# Patient Record
Sex: Female | Born: 1990 | Race: White | Hispanic: No | Marital: Single | Smoking: Never smoker
Health system: Southern US, Community
[De-identification: ages and names within clinical notes are randomized; demographics above are authoritative.]

---
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
## 2013-09-20 ENCOUNTER — Ambulatory Visit (INDEPENDENT_AMBULATORY_CARE_PROVIDER_SITE_OTHER): Payer: Managed Care, Other (non HMO) | Admitting: Emergency Medicine

## 2013-09-20 VITALS — BP 116/54 | HR 85 | Temp 98.3°F | Resp 16 | Ht 62.25 in | Wt 192.4 lb

## 2013-09-20 DIAGNOSIS — Z23 Encounter for immunization: Secondary | ICD-10-CM

## 2013-09-20 DIAGNOSIS — Z309 Encounter for contraceptive management, unspecified: Secondary | ICD-10-CM

## 2013-09-20 DIAGNOSIS — Z0000 Encounter for general adult medical examination without abnormal findings: Secondary | ICD-10-CM

## 2013-09-20 DIAGNOSIS — Z113 Encounter for screening for infections with a predominantly sexual mode of transmission: Secondary | ICD-10-CM

## 2013-09-20 LAB — POCT CBC
Granulocyte percent: 60.2 %G (ref 37–80)
HEMATOCRIT: 40.4 % (ref 37.7–47.9)
Hemoglobin: 12.9 g/dL (ref 12.2–16.2)
LYMPH, POC: 1.6 (ref 0.6–3.4)
MCH: 27.4 pg (ref 27–31.2)
MCHC: 31.9 g/dL (ref 31.8–35.4)
MCV: 85.8 fL (ref 80–97)
MID (cbc): 0.4 (ref 0–0.9)
MPV: 9.9 fL (ref 0–99.8)
POC GRANULOCYTE: 3 (ref 2–6.9)
POC LYMPH %: 32.6 % (ref 10–50)
POC MID %: 7.2 %M (ref 0–12)
Platelet Count, POC: 251 10*3/uL (ref 142–424)
RBC: 4.71 M/uL (ref 4.04–5.48)
RDW, POC: 15 %
WBC: 5 10*3/uL (ref 4.6–10.2)

## 2013-09-20 LAB — LIPID PANEL
CHOL/HDL RATIO: 3.2 ratio
Cholesterol: 175 mg/dL (ref 0–200)
HDL: 54 mg/dL (ref >39)
LDL Cholesterol: 104 mg/dL — ABNORMAL HIGH (ref 0–99)
Triglycerides: 84 mg/dL (ref <150)
VLDL: 17 mg/dL (ref 0–40)

## 2013-09-20 LAB — POCT URINE PREGNANCY: PREG TEST UR: NEGATIVE

## 2013-09-20 LAB — POCT URINALYSIS DIPSTICK
BILIRUBIN UA: NEGATIVE
Glucose, UA: NEGATIVE
Leukocytes, UA: NEGATIVE
NITRITE UA: NEGATIVE
PH UA: 6
PROTEIN UA: NEGATIVE
Spec Grav, UA: 1.025
Urobilinogen, UA: 1

## 2013-09-20 LAB — COMPREHENSIVE METABOLIC PANEL
ALBUMIN: 3.9 g/dL (ref 3.5–5.2)
ALT: 11 U/L (ref 0–35)
AST: 15 U/L (ref 0–37)
Alkaline Phosphatase: 77 U/L (ref 39–117)
BILIRUBIN TOTAL: 0.4 mg/dL (ref 0.2–1.2)
BUN: 9 mg/dL (ref 6–23)
CO2: 24 mEq/L (ref 19–32)
Calcium: 8.5 mg/dL (ref 8.4–10.5)
Chloride: 108 mEq/L (ref 96–112)
Creat: 0.59 mg/dL (ref 0.50–1.10)
GLUCOSE: 87 mg/dL (ref 70–99)
Potassium: 4.2 mEq/L (ref 3.5–5.3)
SODIUM: 143 meq/L (ref 135–145)
TOTAL PROTEIN: 6.1 g/dL (ref 6.0–8.3)

## 2013-09-20 LAB — RPR

## 2013-09-20 LAB — GLUCOSE, POCT (MANUAL RESULT ENTRY): POC Glucose: 84 mg/dl (ref 70–99)

## 2013-09-20 LAB — HIV ANTIBODY (ROUTINE TESTING W REFLEX): HIV: NONREACTIVE

## 2013-09-20 MED ORDER — DROSPIRENONE-ETHINYL ESTRADIOL 3-0.02 MG PO TABS: 1.0000 | ORAL_TABLET | Freq: Every day | ORAL | Status: DC

## 2013-09-20 NOTE — Progress Notes (Signed)
Physical Exam  Pulmonary/Chest: Right breast exhibits no mass, no nipple discharge, no skin change and no tenderness. Left breast exhibits no mass, no nipple discharge, no skin change and no tenderness.  Genitourinary: Vagina normal and uterus normal. Pelvic exam was performed with patient supine. There is no rash, tenderness or lesion on the right labia. There is no rash, tenderness or lesion on the left labia. Cervix exhibits discharge (brown discharge). Cervix exhibits no motion tenderness and no friability. Right adnexum displays no mass, no tenderness and no fullness. Left adnexum displays no mass, no tenderness and no fullness.    Rhoderick MoodyHeather Deanette Tullius, PA-C

## 2013-09-20 NOTE — Progress Notes (Signed)
Subjective:  Patient ID: Carolyn Morris, female    DOB: 1990-10-28, 23 y.o.   MRN: [id number]  This chart was scribed for Lesle Chris, MD by Marica Otter, ED Scribe. This patient was seen in room 13 and the patient's care was started at 10:17AM.    HPI HPI Comments: Carolyn Morris is a 23 y.o. female who presents [facility] the [facility] for a physical, papsmear, and starting birth control. Pt reports that it has been a few years since her last physical; and her last physical before then was when she was a child.   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 Pt denies any additional forms of exercise. Pt complains of intermittent fatigue which she attributes to her working conditions. Pt also complains of intermittent back pain and lower leg pain; and reports some dental problems requiring teeth removal. Pt states she has regular periods.  Pt states she is currently sexually active and has had 5 sexual partners. Pt denies having any HPV vaccines.  Pt has never been on any birth control except condoms.   Pt reports getting a tetanus shot 5 years ago. Pt reports her mother has high cholesterol. Pt denies being a smoker.    Review of Systems  Constitutional: Positive for fatigue.  HENT: Positive for dental problem (Teeth Removal ).   Eyes: Negative.   Respiratory: Negative.   Genitourinary: Negative.   Musculoskeletal: Positive for back pain and myalgias (Lower Leg Pain).  Neurological: Negative.   Psychiatric/Behavioral: Negative.    Objective:   Physical Exam CONSTITUTIONAL: Well developed/well nourished HEAD: Normocephalic/atraumatic EYES: EOMI/PERRL ENMT: Mucous membranes moist NECK: supple no meningeal signs SPINE:entire spine nontender CV: S1/S2 noted, no murmurs/rubs/gallops noted LUNGS: Lungs are clear to auscultation bilaterally, no apparent distress ABDOMEN: soft, nontender, no rebound or  guarding GU:no cva tenderness NEURO: Pt is awake/alert, moves all extremitiesx4 EXTREMITIES: pulses normal, full ROM SKIN: warm, color normal PSYCH: no abnormalities of mood noted  Filed Vitals:   09/20/13 1004  BP: 116/54  Pulse: 85  Temp: 98.3 F (36.8 C)  TempSrc: Oral  Resp: 16  Height: 5' 2.25" (1.581 m)  Weight: 192 lb 6.4 oz (87.272 kg)  SpO2: 100%   No results found for this or any previous visit.  Results for orders placed in visit on 09/20/13  LIPID PANEL      Result Value Ref Range   Cholesterol 175  0 - 200 mg/dL   Triglycerides 84  <952 mg/dL   HDL 54  >84 mg/dL   Total CHOL/HDL Ratio 3.2     VLDL 17  0 - 40 mg/dL   LDL Cholesterol 132 (*) 0 - 99 mg/dL  COMPREHENSIVE METABOLIC PANEL      Result Value Ref Range   Sodium 143  135 - 145 mEq/L   Potassium 4.2  3.5 - 5.3 mEq/L   Chloride 108  96 - 112 mEq/L   CO2 24  19 - 32 mEq/L   Glucose, Bld 87  70 - 99 mg/dL   BUN 9  6 - 23 mg/dL   Creat 4.40  1.02 - 7.25 mg/dL   Total Bilirubin 0.4  0.2 - 1.2 mg/dL   Alkaline Phosphatase 77  39 - 117 U/L   AST 15  0 - 37 U/L   ALT 11  0 - 35 U/L   Total Protein 6.1  6.0 - 8.3 g/dL   Albumin 3.9  3.5 - 5.2 g/dL   Calcium 8.5  8.4 - 16.110.5 mg/dL  RPR      Result Value Ref Range   RPR NON REAC  NON REAC  HIV ANTIBODY (ROUTINE TESTING)      Result Value Ref Range   HIV NON REACTIVE  NON REACTIVE  POCT CBC      Result Value Ref Range   WBC 5.0  4.6 - 10.2 K/uL   Lymph, poc 1.6  0.6 - 3.4   POC LYMPH PERCENT 32.6  10 - 50 %L   MID (cbc) 0.4  0 - 0.9   POC MID % 7.2  0 - 12 %M   POC Granulocyte 3.0  2 - 6.9   Granulocyte percent 60.2  37 - 80 %G   RBC 4.71  4.04 - 5.48 M/uL   Hemoglobin 12.9  12.2 - 16.2 g/dL   HCT, POC 09.640.4  04.537.7 - 47.9 %   MCV 85.8  80 - 97 fL   MCH, POC 27.4  27 - 31.2 pg   MCHC 31.9  31.8 - 35.4 g/dL   RDW, POC 40.915.0     Platelet Count, POC 251  142 - 424 K/uL   MPV 9.9  0 - 99.8 fL  GLUCOSE, POCT (MANUAL RESULT ENTRY)      Result Value  Ref Range   POC Glucose 84  70 - 99 mg/dl  POCT URINE PREGNANCY      Result Value Ref Range   Preg Test, Ur Negative    POCT URINALYSIS DIPSTICK      Result Value Ref Range   Color, UA yellow     Clarity, UA clear     Glucose, UA neg     Bilirubin, UA neg     Ketones, UA trace     Spec Grav, UA 1.025     Blood, UA small     pH, UA 6.0     Protein, UA neg     Urobilinogen, UA 1.0     Nitrite, UA neg     Leukocytes, UA Negative         Assessment & Plan:    plan start patient on birth control pills. She is advised to continue to use condoms. I will call with her lab results. }I personally performed the services described in this documentation, which was scribed in my presence. The recorded information has been reviewed and is accurate.

## 2013-09-21 LAB — PAP IG, CT-NG, RFX HPV ASCU
Chlamydia Probe Amp: NEGATIVE
GC Probe Amp: NEGATIVE

## 2014-09-09 ENCOUNTER — Ambulatory Visit (INDEPENDENT_AMBULATORY_CARE_PROVIDER_SITE_OTHER): Payer: BLUE CROSS/BLUE SHIELD | Admitting: Physician Assistant

## 2014-09-09 ENCOUNTER — Telehealth: Payer: Self-pay | Admitting: *Deleted

## 2014-09-09 VITALS — BP 114/72 | HR 78 | Temp 97.7°F | Resp 16 | Ht 62.0 in | Wt 204.6 lb

## 2014-09-09 DIAGNOSIS — J069 Acute upper respiratory infection, unspecified: Secondary | ICD-10-CM

## 2014-09-09 DIAGNOSIS — B9789 Other viral agents as the cause of diseases classified elsewhere: Principal | ICD-10-CM

## 2014-09-09 DIAGNOSIS — J4520 Mild intermittent asthma, uncomplicated: Secondary | ICD-10-CM

## 2014-09-09 DIAGNOSIS — R062 Wheezing: Secondary | ICD-10-CM

## 2014-09-09 MED ORDER — ALBUTEROL SULFATE HFA 108 (90 BASE) MCG/ACT IN AERS: 2.0000 | INHALATION_SPRAY | RESPIRATORY_TRACT | Status: DC | PRN

## 2014-09-09 MED ORDER — BENZONATATE 100 MG PO CAPS: 100.0000 mg | ORAL_CAPSULE | Freq: Three times a day (TID) | ORAL | Status: DC | PRN

## 2014-09-09 MED ORDER — HYDROCOD POLST-CHLORPHEN POLST 10-8 MG/5ML PO LQCR: 5.0000 mL | Freq: Two times a day (BID) | ORAL | Status: DC | PRN

## 2014-09-09 MED ORDER — IPRATROPIUM BROMIDE 0.03 % NA SOLN: 2.0000 | Freq: Two times a day (BID) | NASAL | Status: DC

## 2014-09-09 NOTE — Progress Notes (Signed)
Subjective:    Patient ID: Barbaraann Boys, female    DOB: [birth year], 24 y.o.   MRN: [id number]  HPI  This is a 24 year old female presenting with cough and nasal congestion x 1 week. Cough is productive of clear/yellow mucus. Nasal discharge clear/yellow as well. Cough is worse at night. Throat feels itchy but not sore. Pt has a history of asthma - she does not have an inhaler at home. Last time she had to use an inhaler was 2 years ago with a viral URI. Pt states she has had some wheezing and SOB with current illness, usually with exertion. Denies otalgia, sinus pressure, fever, chills. Tried tylenol cold and congestion with some relief. She is not a smoker.  Review of Systems  Constitutional: Negative for fever and chills.  HENT: Positive for congestion. Negative for ear pain, sinus pressure and sore throat.   Eyes: Negative for redness.  Respiratory: Positive for cough, shortness of breath and wheezing.   Gastrointestinal: Negative for nausea and vomiting.  Skin: Negative for rash.  Hematological: Negative for adenopathy.  Psychiatric/Behavioral: Positive for sleep disturbance.   There are no active problems to display for this patient.  Home meds: none  No Known Allergies  Patient's social and family history were reviewed.     Objective:   Physical Exam  Constitutional: She is oriented to person, place, and time. She appears well-developed and well-nourished. No distress.  HENT:  Head: Normocephalic and atraumatic.  Right Ear: Hearing, external ear and ear canal normal. Tympanic membrane is retracted.  Left Ear: Hearing, external ear and ear canal normal. Tympanic membrane is retracted.  Nose: Mucosal edema present. Right sinus exhibits no maxillary sinus tenderness and no frontal sinus tenderness. Left sinus exhibits no maxillary sinus tenderness and no frontal sinus tenderness.  Mouth/Throat: Uvula is midline and mucous membranes are normal. Posterior oropharyngeal  erythema present. No oropharyngeal exudate or posterior oropharyngeal edema.  Eyes: Conjunctivae and lids are normal. Right eye exhibits no discharge. Left eye exhibits no discharge. No scleral icterus.  Cardiovascular: Normal rate, regular rhythm, normal heart sounds, intact distal pulses and normal pulses.   No murmur heard. Pulmonary/Chest: Effort normal and breath sounds normal. No respiratory distress. She has no wheezes. She has no rhonchi. She has no rales.  Musculoskeletal: Normal range of motion.  Lymphadenopathy:       Head (right side): No submental, no submandibular and no tonsillar adenopathy present.       Head (left side): No submental, no submandibular and no tonsillar adenopathy present.    She has no cervical adenopathy.  Neurological: She is alert and oriented to person, place, and time.  Skin: Skin is warm, dry and intact. No lesion and no rash noted.  Psychiatric: She has a normal mood and affect. Her speech is normal and behavior is normal. Thought content normal.   BP 114/72 mmHg  Pulse 78  Temp(Src) 97.7 F (36.5 C) (Oral)  Resp 16  Ht  (1.575 m)  Wt 204 lb 9.6 oz (92.806 kg)  BMI 37.41 kg/m2  SpO2 100%  LMP 09/04/2014     Assessment & Plan:  1. Viral URI with cough 2. Wheezing This is likely a viral URI. Focus is on supportive care, see meds prescribed below. Albuterol for wheezing/SOB. She will call in 4-5 days if symptoms are not improving and I will call in an antibiotic.  - chlorpheniramine-HYDROcodone (TUSSIONEX PENNKINETIC ER) 10-8 MG/5ML LQCR; Take 5 mLs by mouth every  12 (twelve) hours as needed for cough (cough).  Dispense: 80 mL; Refill: 0 - ipratropium (ATROVENT) 0.03 % nasal spray; Place 2 sprays into both nostrils 2 (two) times daily.  Dispense: 30 mL; Refill: 0 - benzonatate (TESSALON) 100 MG capsule; Take 1-2 capsules (100-200 mg total) by mouth 3 (three) times daily as needed for cough.  Dispense: 40 capsule; Refill: 0 - albuterol  (PROVENTIL HFA;VENTOLIN HFA) 108 (90 BASE) MCG/ACT inhaler; Inhale 2 puffs into the lungs every 4 (four) hours as needed for wheezing or shortness of breath (cough, shortness of breath or wheezing.).  Dispense: 1 Inhaler; Refill: 1   Tristyn Demarest V. Dyke BrackettBush, PA-C, MHS [facility]  09/09/2014

## 2014-09-09 NOTE — Patient Instructions (Signed)
Use nasal spray twice a day for nasal congestion. Use cough syrup at night for sleep. Use tessalon during the day for cough. Use albuterol every 4 hours as needed for wheezing/shortness of breath. Call me in 4-5 days if you are not getting any better and I will call in antibiotics.

## 2014-09-09 NOTE — Telephone Encounter (Signed)
Pt called and stated that her pharmacy [facility] was closed and wanted to know if we could switch her meds to [facility] [address].  Pt was seen today by Lanier ClamNicole Bush.  Pt was advised her meds would be switched.

## 2014-09-10 DIAGNOSIS — J45909 Unspecified asthma, uncomplicated: Secondary | ICD-10-CM | POA: Insufficient documentation

## 2014-12-08 ENCOUNTER — Telehealth: Payer: Self-pay | Admitting: Emergency Medicine

## 2014-12-08 ENCOUNTER — Ambulatory Visit (INDEPENDENT_AMBULATORY_CARE_PROVIDER_SITE_OTHER): Payer: BLUE CROSS/BLUE SHIELD | Admitting: Physician Assistant

## 2014-12-08 ENCOUNTER — Encounter: Payer: Self-pay | Admitting: Physician Assistant

## 2014-12-08 VITALS — BP 108/72 | HR 91 | Temp 98.4°F | Resp 20 | Ht 62.0 in | Wt 203.5 lb

## 2014-12-08 DIAGNOSIS — J069 Acute upper respiratory infection, unspecified: Secondary | ICD-10-CM

## 2014-12-08 MED ORDER — HYDROCODONE-HOMATROPINE 5-1.5 MG/5ML PO SYRP: 5.0000 mL | ORAL_SOLUTION | Freq: Three times a day (TID) | ORAL | Status: DC | PRN

## 2014-12-08 NOTE — Progress Notes (Signed)
   Subjective:    Patient ID: Carolyn Morris, female    DOB: 06/09/1991, 24 y.o.   MRN: [id number]  Chief Complaint  Patient presents with  . Cough    x 2 days   Patient Active Problem List   Diagnosis Date Noted  . Asthma, chronic 09/10/2014   Prior to Admission medications   Medication Sig Start Date End Date Taking? Authorizing Provider  drospirenone-ethinyl estradiol (YAZ,GIANVI,LORYNA) 3-0.02 MG tablet Take 1 tablet by mouth daily. Patient not taking: Reported on 09/09/2014 09/20/13   Collene GobbleSteven A Daub, MD  HYDROcodone-homatropine [facility](HYCODAN) 5-1.5 MG/5ML syrup Take 5 mLs by mouth every 8 (eight) hours as needed for cough. 12/08/14   Raelyn Ensignodd Deriana Vanderhoef, PA   Medications, allergies, past medical history, surgical history, family history, social history and problem list reviewed and updated.  HPI  89 yof presents with 3 day h/o uri sx.   Sx started 3 days ago with mildly prod cough with yellow sputum. Throughout the day. Head/nasal congestion past couple days. Denies st, otalgia, abd pain, n/v. Mild diarrhea past couple days. Non bloody.   Took delsym yest which helped a bit. Mucinex today helped a bit.   Was seen by us 09/09/14 - tx for viral uri with ipratropium nasal spray, tessalon, tussionex, albuterol prn. She doesn't feel like these measures helped much. She never picked up the albuterol due to copay, hasn't felt like she has been wheezing.   Review of Systems No fevers, chills.     Objective:   Physical Exam  Constitutional: She appears well-developed and well-nourished.  Non-toxic appearance. She does not have a sickly appearance. She does not appear ill. No distress.  BP 108/72 mmHg  Pulse 91  Temp(Src) 98.4 F (36.9 C) (Oral)  Resp 20  Ht 5\' 2"  (1.575 m)  Wt 203 lb 8 oz (92.307 kg)  BMI 37.21 kg/m2  SpO2 98%  LMP 12/06/2014   HENT:  Right Ear: A middle ear effusion is present.  Left Ear: Tympanic membrane normal.  Nose: Mucosal edema and rhinorrhea present. Right  sinus exhibits no maxillary sinus tenderness and no frontal sinus tenderness. Left sinus exhibits no maxillary sinus tenderness and no frontal sinus tenderness.  Mouth/Throat: Uvula is midline, oropharynx is clear and moist and mucous membranes are normal.  Pulmonary/Chest: Effort normal. No tachypnea. She has no decreased breath sounds. She has no wheezes. She has no rhonchi. She has no rales.  Lymphadenopathy:       Head (right side): No submental, no submandibular and no tonsillar adenopathy present.       Head (left side): No submental, no submandibular and no tonsillar adenopathy present.    She has no cervical adenopathy.      Assessment & Plan:   [age] yof presents with 3 day h/o uri sx.   Viral URI - Plan: HYDROcodone-homatropine (HYCODAN) 5-1.5 MG/5ML syrup --sx likely viral with normal vitals and relatively benign exam --hycodan for cough, mucinex for congestion, sudafed for congestion, lozenges/humidifier/tylenol as needed if sore throat --let us know if not improving in 4-5 days, possible antibiotic tx at that time  Donnajean Lopesodd M. Rogan Ecklund, PA-C Physician Assistant-Certified [facility]  12/08/2014 3:12 PM

## 2014-12-08 NOTE — Patient Instructions (Signed)
You most likely have a viral upper respiratory infection.  Taking sudafed every 6-8 hours for congestion will help. Taking mucinex with lots of water for congestion will help.  If you're not feeling better in 4-5 days please give us a call and we can move toward antibiotics.

## 2014-12-10 NOTE — Telephone Encounter (Signed)
Please advise on refill.

## 2014-12-13 MED ORDER — AZITHROMYCIN 250 MG PO TABS: ORAL_TABLET | ORAL | Status: DC

## 2014-12-13 NOTE — Telephone Encounter (Signed)
Patient states Carolyn Morris told her to call for an antibiotic.  [facility] [address]    [phone]

## 2014-12-13 NOTE — Telephone Encounter (Signed)
Called and spoke with pt. She does not feel any better. Head still congested. Still coughing. Still feeling fatigued. She is past day 10 of symptoms at this point. She would like an antibiotic. Will send in azithro to pharm.

## 2016-04-11 DIAGNOSIS — E559 Vitamin D deficiency, unspecified: Secondary | ICD-10-CM | POA: Insufficient documentation

## 2016-04-13 ENCOUNTER — Ambulatory Visit (INDEPENDENT_AMBULATORY_CARE_PROVIDER_SITE_OTHER): Payer: BLUE CROSS/BLUE SHIELD | Admitting: Family Medicine

## 2016-04-13 ENCOUNTER — Encounter: Payer: Self-pay | Admitting: Family Medicine

## 2016-04-13 VITALS — BP 111/77 | HR 84 | Ht 60.0 in | Wt 225.0 lb

## 2016-04-13 DIAGNOSIS — Z6841 Body Mass Index (BMI) 40.0 and over, adult: Secondary | ICD-10-CM | POA: Diagnosis not present

## 2016-04-13 DIAGNOSIS — B354 Tinea corporis: Secondary | ICD-10-CM | POA: Diagnosis not present

## 2016-04-13 DIAGNOSIS — Z113 Encounter for screening for infections with a predominantly sexual mode of transmission: Secondary | ICD-10-CM | POA: Diagnosis not present

## 2016-04-13 DIAGNOSIS — J3089 Other allergic rhinitis: Secondary | ICD-10-CM | POA: Diagnosis not present

## 2016-04-13 DIAGNOSIS — J4520 Mild intermittent asthma, uncomplicated: Secondary | ICD-10-CM | POA: Diagnosis not present

## 2016-04-13 DIAGNOSIS — R200 Anesthesia of skin: Secondary | ICD-10-CM

## 2016-04-13 DIAGNOSIS — R202 Paresthesia of skin: Secondary | ICD-10-CM

## 2016-04-13 MED ORDER — TERBINAFINE HCL 1 % EX CREA: TOPICAL_CREAM | CUTANEOUS | 1 refills | Status: DC

## 2016-04-13 NOTE — Progress Notes (Signed)
New patient office visit note:  Impression and Recommendations:    1. Ringworm of body   2. BMI 40.0-44.9, adult (HCC)   3. Environmental and seasonal allergies   4. Screening examination for sexually transmitted disease   5. Numbness and tingling in both hands     Health counseling done regarding ringworm. Please remember to get Lamisil\terbinafine at the drugstore-this is over-the-counter.  Explained to patient what BMI refers to, and what it means medically.    Told patient to think about it as a "medical risk stratification measurement" and how increasing BMI is associated with increasing risk/ or worsening state of various diseases such as hypertension, hyperlipidemia, diabetes, premature OA, depression etc.  American Heart Association guidelines for healthy diet, basically Mediterranean diet, and exercise guidelines of 30 minutes 5 days per week or more discussed in detail.  Health counseling performed.  All questions answered.   Also make a follow-up office visit so we can discuss your blood work that was done today.  Additionally, in the near future, in a couple months please make an appointment so we can do Pap smear and a full female exam.  Pt was in the office today for 40+ minutes, with over 50% time spent in face to face counseling of various medical concerns and in coordination of care  Orders Placed This Encounter  Procedures  . CBC with Differential/Platelet  . COMPLETE METABOLIC PANEL WITH GFR  . Hepatitis C antibody  . HIV antibody  . Lipid panel  . TSH  . VITAMIN D 25 Hydroxy (Vit-D Deficiency, Fractures)  . HSV(herpes smplx)abs-1+2(IgG+IgM)-bld  . RPR  . Hemoglobin A1c  . Vitamin B12    New Prescriptions   TERBINAFINE (LAMISIL) 1 % CREAM    Apply to affected area twice daily until rash gone, then apply 2 more days.    Modified Medications   No medications on file    Discontinued Medications   AZITHROMYCIN (ZITHROMAX) 250 MG TABLET     Take 2 tabs PO x 1 dose, then 1 tab PO QD x 4 days   DROSPIRENONE-ETHINYL ESTRADIOL (GIANVI) 3-0.02 MG TABLET    TAKE ONE TABLET BY MOUTH ONCE DAILY.  "OV NEEDED FOR ADDITIONAL REFILLS"   HYDROCODONE-HOMATROPINE (HYCODAN) 5-1.5 MG/5ML SYRUP    Take 5 mLs by mouth every 8 (eight) hours as needed for cough.    No Follow-up on file.  The patient was counseled, risk factors were discussed, anticipatory guidance given.  Gross side effects, risk and benefits, and alternatives of medications discussed with patient.  Patient is aware that all medications have potential side effects and we are unable to predict every side effect or drug-drug interaction that may occur.  Expresses verbal understanding and consents to current therapy plan and treatment regimen.  Please see AVS handed out to patient at the end of our visit for further patient instructions/ counseling done pertaining to today's office visit.    Note: This document was prepared using Dragon voice recognition software and may include unintentional dictation errors.  ----------------------------------------------------------------------------------------------------------------------    Subjective:    Chief Complaint  Patient presents with  . Establish Care    HPI: Carolyn Morris is a pleasant 25 y.o. female who presents to [facility] today to review their medical history with me and establish care.   I asked the patient to review their chronic problem list with me to ensure everything was updated and accurate.  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 She does not have a significant other. She lives with her aunt and cousin- parents and brother moved back to [place] to be closer to other family members.  Patient has never smoked, drinks only socially and occasionally and no history of drug use. Patient has been sexually active in the past but is not now. She is  interested in getting STD screening.  Patient does not exercise says she walks around work a lot and is to sore and tired at the end of her workday.  -    CC:   upper inner thigh with rash- been there for 1-1.36months now.  Used to be itchy but not now.  Not spreading- no B no W.    Wt Readings from Last 3 Encounters:  04/13/16 225 lb (102.1 kg)  12/08/14 203 lb 8 oz (92.3 kg)  09/09/14 204 lb 9.6 oz (92.8 kg)   BP Readings from Last 3 Encounters:  04/13/16 111/77  12/08/14 108/72  09/09/14 114/72   Pulse Readings from Last 3 Encounters:  04/13/16 84  12/08/14 91  09/09/14 78   BMI Readings from Last 3 Encounters:  04/13/16 43.94 kg/m  12/08/14 37.22 kg/m  09/09/14 37.42 kg/m    Patient Active Problem List   Diagnosis Date Noted  . BMI 40.0-44.9, adult (HCC) 04/13/2016  . Environmental and seasonal allergies 04/13/2016  . Ringworm of body 04/13/2016  . 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 No pertinent past medical history.   History reviewed. No pertinent surgical history.   Family History  Problem Relation Age of Onset  . Hyperlipidemia Mother   . Diabetes Mother   . Diabetes Maternal Grandmother   . Cancer Paternal Aunt     skin cancer  . Diabetes Paternal Aunt      History  Drug Use No    History  Alcohol Use  . 0.0 oz/week    History  Smoking Status  . Never Smoker  Smokeless Tobacco  . Never Used    Patient's Medications  New Prescriptions   TERBINAFINE (LAMISIL) 1 % CREAM    Apply to affected area twice daily until rash gone, then apply 2 more days.  Previous Medications   No medications on file  Modified Medications   No medications on file  Discontinued Medications   AZITHROMYCIN (ZITHROMAX) 250 MG TABLET    Take 2 tabs PO x 1 dose, then 1 tab PO QD x 4 days   DROSPIRENONE-ETHINYL ESTRADIOL (GIANVI) 3-0.02 MG TABLET    TAKE ONE TABLET BY MOUTH ONCE DAILY.  "OV NEEDED FOR ADDITIONAL REFILLS"    HYDROCODONE-HOMATROPINE (HYCODAN) 5-1.5 MG/5ML SYRUP    Take 5 mLs by mouth every 8 (eight) hours as needed for cough.    Allergies: Review of patient's allergies indicates no known allergies.  Review of Systems  Constitutional: Negative.  Negative for chills, diaphoresis, fever, malaise/fatigue and weight loss.  HENT: Positive for tinnitus. Negative for congestion, hearing loss and sore throat.   Eyes: Negative.  Negative for blurred vision, double vision and photophobia.  Respiratory: Negative.  Negative for cough and wheezing.   Cardiovascular: Negative.  Negative for chest pain and palpitations.  Gastrointestinal: Negative.  Negative for blood in stool, diarrhea, nausea and vomiting.  Genitourinary: Negative.  Negative for dysuria, frequency and urgency.  Musculoskeletal: Positive for joint pain and myalgias. Negative for falls.       Patient has had muscle or  joint pain at one time in her life.  Skin: Positive for rash. Negative for itching.  Neurological: Negative.  Negative for dizziness, focal weakness, weakness and headaches.  Endo/Heme/Allergies: Negative for environmental allergies and polydipsia. Bruises/bleeds easily.  Psychiatric/Behavioral: Negative.  Negative for depression and memory loss. The patient is not nervous/anxious and does not have insomnia.      Objective:   Blood pressure 111/77, pulse 84, height 5' (1.524 m), weight 225 lb (102.1 kg), SpO2 97 %. Body mass index is 43.94 kg/m. General: Well Developed, well nourished, and in no acute distress.  Neuro: Alert and oriented x3, extra-ocular muscles intact, sensation grossly intact.  HEENT: Normocephalic, atraumatic, pupils equal round reactive to light, neck supple Skin: no gross suspicious lesions or rashes  Cardiac: Regular rate and rhythm, no murmurs rubs or gallops.  Respiratory: Essentially clear to auscultation bilaterally. Not using accessory muscles, speaking in full sentences.  Abdominal: Soft, not  grossly distended Musculoskeletal: Ambulates w/o diff, FROM * 4 ext.  Vasc: less 2 sec cap RF, warm and pink  Psych:  No HI/SI, judgement and insight good, Euthymic mood. Full Affect.

## 2016-04-13 NOTE — Patient Instructions (Addendum)
  Please remember to get Lamisil\terbinafine at the drugstore-this is over-the-counter.  Also make a follow-up office visit so we can discuss your blood work that was done today.  Additionally, in the near future, in a couple months please make an appointment so we can do Pap smear and a full female exam.    Body Ringworm Ringworm (tinea corporis) is a fungal infection of the skin on the body. This infection is not caused by worms, but is actually caused by a fungus. Fungus normally lives on the top of your skin and can be useful. However, in the case of ringworms, the fungus grows out of control and causes a skin infection. It can involve any area of skin on the body and can spread easily from one person to another (contagious). Ringworm is a common problem for children, but it can affect adults as well. Ringworm is also often found in athletes, especially wrestlers who share equipment and mats.  CAUSES  Ringworm of the body is caused by a fungus called dermatophyte. It can spread by:  Touchingother people who are infected.  Touchinginfected pets.  Touching or sharingobjects that have been in contact with the infected person or pet (hats, combs, towels, clothing, sports equipment). SYMPTOMS   Itchy, raised red spots and bumps on the skin.  Ring-shaped rash.  Redness near the border of the rash with a clear center.  Dry and scaly skin on or around the rash. Not every person develops a ring-shaped rash. Some develop only the red, scaly patches. DIAGNOSIS  Most often, ringworm can be diagnosed by performing a skin exam. Your caregiver may choose to take a skin scraping from the affected area. The sample will be examined under the microscope to see if the fungus is present.  TREATMENT  Body ringworm may be treated with a topical antifungal cream or ointment. Sometimes, an antifungal shampoo that can be used on your body is prescribed. You may be prescribed antifungal medicines to take  by mouth if your ringworm is severe, keeps coming back, or lasts a long time.  HOME CARE INSTRUCTIONS   Only take over-the-counter or prescription medicines as directed by your caregiver.  Wash the infected area and dry it completely before applying yourcream or ointment.  When using antifungal shampoo to treat the ringworm, leave the shampoo on the body for 3-5 minutes before rinsing.   Wear loose clothing to stop clothes from rubbing and irritating the rash.  Wash or change your bed sheets every night while you have the rash.  Have your pet treated by your veterinarian if it has the same infection. To prevent ringworm:   Practice good hygiene.  Wear sandals or shoes in public places and showers.  Do not share personal items with others.  Avoid touching red patches of skin on other people.  Avoid touching pets that have bald spots or wash your hands after doing so. SEEK MEDICAL CARE IF:   Your rash continues to spread after 7 days of treatment.  Your rash is not gone in 4 weeks.  The area around your rash becomes red, warm, tender, and swollen.   This information is not intended to replace advice given to you by your health care provider. Make sure you discuss any questions you have with your health care provider.   Document Released: 06/26/2000 Document Revised: 03/23/2012 Document Reviewed: 01/11/2012 [employer] Interactive Patient Education [employer]2016 [employer]

## 2016-04-14 LAB — COMPLETE METABOLIC PANEL WITH GFR
ALT: 11 U/L (ref 6–29)
AST: 14 U/L (ref 10–30)
Albumin: 4.1 g/dL (ref 3.6–5.1)
Alkaline Phosphatase: 78 U/L (ref 33–115)
BUN: 7 mg/dL (ref 7–25)
CHLORIDE: 108 mmol/L (ref 98–110)
CO2: 24 mmol/L (ref 20–31)
Calcium: 9 mg/dL (ref 8.6–10.2)
Creat: 0.65 mg/dL (ref 0.50–1.10)
Glucose, Bld: 84 mg/dL (ref 65–99)
POTASSIUM: 4.1 mmol/L (ref 3.5–5.3)
Sodium: 141 mmol/L (ref 135–146)
Total Bilirubin: 0.4 mg/dL (ref 0.2–1.2)
Total Protein: 6.1 g/dL (ref 6.1–8.1)

## 2016-04-14 LAB — CBC WITH DIFFERENTIAL/PLATELET
BASOS PCT: 1 %
Basophils Absolute: 56 cells/uL (ref 0–200)
EOS ABS: 56 {cells}/uL (ref 15–500)
Eosinophils Relative: 1 %
HCT: 39.7 % (ref 35.0–45.0)
Hemoglobin: 12.7 g/dL (ref 11.7–15.5)
LYMPHS ABS: 1512 {cells}/uL (ref 850–3900)
Lymphocytes Relative: 27 %
MCH: 25.8 pg — AB (ref 27.0–33.0)
MCHC: 32 g/dL (ref 32.0–36.0)
MCV: 80.5 fL (ref 80.0–100.0)
MONOS PCT: 8 %
MPV: 11.2 fL (ref 7.5–12.5)
Monocytes Absolute: 448 cells/uL (ref 200–950)
NEUTROS ABS: 3528 {cells}/uL (ref 1500–7800)
Neutrophils Relative %: 63 %
PLATELETS: 249 10*3/uL (ref 140–400)
RBC: 4.93 MIL/uL (ref 3.80–5.10)
RDW: 14.7 % (ref 11.0–15.0)
WBC: 5.6 10*3/uL (ref 3.8–10.8)

## 2016-04-14 LAB — LIPID PANEL
CHOL/HDL RATIO: 3.4 ratio (ref <=5.0)
CHOLESTEROL: 161 mg/dL (ref 125–200)
HDL: 48 mg/dL (ref >=46)
LDL Cholesterol: 92 mg/dL (ref <130)
Triglycerides: 106 mg/dL (ref <150)
VLDL: 21 mg/dL (ref <30)

## 2016-04-14 LAB — VITAMIN D 25 HYDROXY (VIT D DEFICIENCY, FRACTURES): Vit D, 25-Hydroxy: 17 ng/mL — ABNORMAL LOW (ref 30–100)

## 2016-04-14 LAB — HEPATITIS C ANTIBODY: HCV AB: NEGATIVE

## 2016-04-14 LAB — HEMOGLOBIN A1C
HEMOGLOBIN A1C: 4.9 % (ref <5.7)
MEAN PLASMA GLUCOSE: 94 mg/dL

## 2016-04-14 LAB — VITAMIN B12: Vitamin B-12: 457 pg/mL (ref 200–1100)

## 2016-04-14 LAB — TSH: TSH: 2.53 mIU/L

## 2016-04-14 LAB — FLUORESCENT TREPONEMAL AB(FTA)-IGG-BLD: Fluorescent Treponemal ABS: NONREACTIVE

## 2016-04-14 LAB — HIV ANTIBODY (ROUTINE TESTING W REFLEX): HIV: NONREACTIVE

## 2016-04-15 LAB — RPR TITER: RPR Titer: 1:1 {titer}

## 2016-04-15 LAB — RPR: RPR Ser Ql: REACTIVE — AB

## 2016-04-16 LAB — HSV(HERPES SMPLX)ABS-I+II(IGG+IGM)-BLD
HSV 1 Glycoprotein G Ab, IgG: <0.9 Index (ref <0.90)
HSV 1 IGM SCREEN: NEGATIVE
HSV 2 GLYCOPROTEIN G AB, IGG: 1.25 {index} — AB (ref <0.90)
HSV 2 IGM SCREEN: NEGATIVE

## 2016-04-22 ENCOUNTER — Other Ambulatory Visit: Payer: Self-pay

## 2016-04-22 MED ORDER — VITAMIN D (ERGOCALCIFEROL) 1.25 MG (50000 UNIT) PO CAPS: 50000.0000 [IU] | ORAL_CAPSULE | ORAL | 3 refills | Status: DC

## 2016-04-23 ENCOUNTER — Other Ambulatory Visit (INDEPENDENT_AMBULATORY_CARE_PROVIDER_SITE_OTHER): Payer: BLUE CROSS/BLUE SHIELD

## 2016-04-23 DIAGNOSIS — A530 Latent syphilis, unspecified as early or late: Secondary | ICD-10-CM | POA: Diagnosis not present

## 2016-04-23 DIAGNOSIS — R894 Abnormal immunological findings in specimens from other organs, systems and tissues: Secondary | ICD-10-CM | POA: Diagnosis not present

## 2016-04-23 NOTE — Addendum Note (Signed)
Addended by: Stan HeadNELSON, [name] S on: 04/23/2016 08:34 AM   Modules accepted: Orders

## 2016-04-24 LAB — HSV(HERPES SMPLX)ABS-I+II(IGG+IGM)-BLD
HSV 2 Glycoprotein G Ab, IgG: 1.13 Index — ABNORMAL HIGH (ref <0.90)
Herpes Simplex Vrs I&II-IgM Ab (EIA): 1.19 INDEX — ABNORMAL HIGH

## 2016-04-24 LAB — FLUORESCENT TREPONEMAL AB(FTA)-IGG-BLD: Fluorescent Treponemal ABS: NONREACTIVE

## 2016-05-12 ENCOUNTER — Ambulatory Visit (INDEPENDENT_AMBULATORY_CARE_PROVIDER_SITE_OTHER): Payer: BLUE CROSS/BLUE SHIELD | Admitting: Family Medicine

## 2016-05-12 ENCOUNTER — Other Ambulatory Visit (HOSPITAL_COMMUNITY)
Admission: RE | Admit: 2016-05-12 | Discharge: 2016-05-12 | Disposition: A | Discharge status: Home / Self Care | Payer: BLUE CROSS/BLUE SHIELD | Source: Ambulatory Visit | Attending: Family Medicine | Admitting: Family Medicine

## 2016-05-12 ENCOUNTER — Encounter: Payer: Self-pay | Admitting: Family Medicine

## 2016-05-12 VITALS — BP 102/70 | HR 89 | Ht 62.0 in | Wt 215.6 lb

## 2016-05-12 DIAGNOSIS — Z0000 Encounter for general adult medical examination without abnormal findings: Secondary | ICD-10-CM | POA: Diagnosis not present

## 2016-05-12 DIAGNOSIS — E669 Obesity, unspecified: Secondary | ICD-10-CM

## 2016-05-12 DIAGNOSIS — Z7189 Other specified counseling: Secondary | ICD-10-CM | POA: Diagnosis not present

## 2016-05-12 DIAGNOSIS — Z113 Encounter for screening for infections with a predominantly sexual mode of transmission: Secondary | ICD-10-CM | POA: Diagnosis not present

## 2016-05-12 DIAGNOSIS — Z01419 Encounter for gynecological examination (general) (routine) without abnormal findings: Secondary | ICD-10-CM | POA: Insufficient documentation

## 2016-05-12 DIAGNOSIS — Z124 Encounter for screening for malignant neoplasm of cervix: Secondary | ICD-10-CM | POA: Diagnosis not present

## 2016-05-12 DIAGNOSIS — B354 Tinea corporis: Secondary | ICD-10-CM

## 2016-05-12 DIAGNOSIS — IMO0001 Reserved for inherently not codable concepts without codable children: Secondary | ICD-10-CM

## 2016-05-12 MED ORDER — CICLOPIROX OLAMINE 0.77 % EX CREA: TOPICAL_CREAM | Freq: Two times a day (BID) | CUTANEOUS | 0 refills | Status: DC

## 2016-05-12 NOTE — Patient Instructions (Signed)
Please realize, EXERCISE IS MEDICINE!  -  American Heart Association ( AHA) guidelines for exercise : If you are in good health, without any medical conditions, you should engage in 150 minutes of moderate intensity aerobic activity per week.  This means you should be huffing and puffing throughout your workout.   Engaging in regular exercise will improve brain function and memory, as well as improve mood, boost immune system and help with weight management.  As well as the other, more well-known effects of exercise such as decreasing blood sugar levels, decreasing blood pressure,  and decreasing bad cholesterol levels/ increasing good cholesterol levels.     -  The AHA strongly endorses consumption of a diet that contains a variety of foods from all the food categories with an emphasis on fruits and vegetables; fat-free and low-fat dairy products; cereal and grain products; legumes and nuts; and fish, poultry, and/or extra lean meats.    Excessive food intake, especially of foods high in saturated and trans fats, sugar, and salt, should be avoided.    Adequate water intake of roughly 1/2 of your weight in pounds, should equal the ounces of water per day you should drink.  So for instance, if you're 200 pounds, that would be 100 ounces of water per day.         Mediterranean Diet  Why follow it? Research shows. . Those who follow the Mediterranean diet have a reduced risk of heart disease  . The diet is associated with a reduced incidence of Parkinson's and Alzheimer's diseases . People following the diet may have longer life expectancies and lower rates of chronic diseases  . The Dietary Guidelines for Americans recommends the Mediterranean diet as an eating plan to promote health and prevent disease  What Is the Mediterranean Diet?  . Healthy eating plan based on typical foods and recipes of Mediterranean-style cooking . The diet is primarily a plant based diet; these foods should make up a  majority of meals   Starches - Plant based foods should make up a majority of meals - They are an important sources of vitamins, minerals, energy, antioxidants, and fiber - Choose whole grains, foods high in fiber and minimally processed items  - Typical grain sources include wheat, oats, barley, corn, brown rice, bulgar, farro, millet, polenta, couscous  - Various types of beans include chickpeas, lentils, fava beans, black beans, white beans   Fruits  Veggies - Large quantities of antioxidant rich fruits & veggies; 6 or more servings  - Vegetables can be eaten raw or lightly drizzled with oil and cooked  - Vegetables common to the traditional Mediterranean Diet include: artichokes, arugula, beets, broccoli, brussel sprouts, cabbage, carrots, celery, collard greens, cucumbers, eggplant, kale, leeks, lemons, lettuce, mushrooms, okra, onions, peas, peppers, potatoes, pumpkin, radishes, rutabaga, shallots, spinach, sweet potatoes, turnips, zucchini - Fruits common to the Mediterranean Diet include: apples, apricots, avocados, cherries, clementines, dates, figs, grapefruits, grapes, melons, nectarines, oranges, peaches, pears, pomegranates, strawberries, tangerines  Fats - Replace butter and margarine with healthy oils, such as olive oil, canola oil, and tahini  - Limit nuts to no more than a handful a day  - Nuts include walnuts, almonds, pecans, pistachios, pine nuts  - Limit or avoid candied, honey roasted or heavily salted nuts - Olives are central to the Mediterranean diet - can be eaten whole or used in a variety of dishes   Meats Protein - Limiting red meat: no more than a few times a month -   When eating red meat: choose lean cuts and keep the portion to the size of deck of cards - Eggs: approx. 0 to 4 times a week  - Fish and lean poultry: at least 2 a week  - Healthy protein sources include, chicken, turkey, lean beef, lamb - Increase intake of seafood such as tuna, salmon, trout,  mackerel, shrimp, scallops - Avoid or limit high fat processed meats such as sausage and bacon  Dairy - Include moderate amounts of low fat dairy products  - Focus on healthy dairy such as fat free yogurt, skim milk, low or reduced fat cheese - Limit dairy products higher in fat such as whole or 2% milk, cheese, ice cream  Alcohol - Moderate amounts of red wine is ok  - No more than 5 oz daily for women (all ages) and men older than age 65  - No more than 10 oz of wine daily for men younger than 65  Other - Limit sweets and other desserts  - Use herbs and spices instead of salt to flavor foods  - Herbs and spices common to the traditional Mediterranean Diet include: basil, bay leaves, chives, cloves, cumin, fennel, garlic, lavender, marjoram, mint, oregano, parsley, pepper, rosemary, sage, savory, sumac, tarragon, thyme   It's not just a diet, it's a lifestyle:  . The Mediterranean diet includes lifestyle factors typical of those in the region  . Foods, drinks and meals are best eaten with others and savored . Daily physical activity is important for overall good health . This could be strenuous exercise like running and aerobics . This could also be more leisurely activities such as walking, housework, yard-work, or taking the stairs . Moderation is the key; a balanced and healthy diet accommodates most foods and drinks . Consider portion sizes and frequency of consumption of certain foods   Meal Ideas & Options:  . Breakfast:  o Whole wheat toast or whole wheat English muffins with peanut butter & hard boiled egg o Steel cut oats topped with apples & cinnamon and skim milk  o Fresh fruit: banana, strawberries, melon, berries, peaches  o Smoothies: strawberries, bananas, greek yogurt, peanut butter o Low fat greek yogurt with blueberries and granola  o Egg white omelet with spinach and mushrooms o Breakfast couscous: whole wheat couscous, apricots, skim milk, cranberries  . Sandwiches:   o Hummus and grilled vegetables (peppers, zucchini, squash) on whole wheat bread   o Grilled chicken on whole wheat pita with lettuce, tomatoes, cucumbers or tzatziki  o Tuna salad on whole wheat bread: tuna salad made with greek yogurt, olives, red peppers, capers, green onions o Garlic rosemary lamb pita: lamb sauted with garlic, rosemary, salt & pepper; add lettuce, cucumber, greek yogurt to pita - flavor with lemon juice and black pepper  . Seafood:  o Mediterranean grilled salmon, seasoned with garlic, basil, parsley, lemon juice and black pepper o Shrimp, lemon, and spinach whole-grain pasta salad made with low fat greek yogurt  o Seared scallops with lemon orzo  o Seared tuna steaks seasoned salt, pepper, coriander topped with tomato mixture of olives, tomatoes, olive oil, minced garlic, parsley, green onions and cappers  . Meats:  o Herbed greek chicken salad with kalamata olives, cucumber, feta  o Red bell peppers stuffed with spinach, bulgur, lean ground beef (or lentils) & topped with feta   o Kebabs: skewers of chicken, tomatoes, onions, zucchini, squash  o Turkey burgers: made with red onions, mint, dill, lemon juice, feta   cheese topped with roasted red peppers . Vegetarian o Cucumber salad: cucumbers, artichoke hearts, celery, red onion, feta cheese, tossed in olive oil & lemon juice  o Hummus and whole grain pita points with a greek salad (lettuce, tomato, feta, olives, cucumbers, red onion) o Lentil soup with celery, carrots made with vegetable broth, garlic, salt and pepper  o Tabouli salad: parsley, bulgur, mint, scallions, cucumbers, tomato, radishes, lemon juice, olive oil, salt and pepper.      Preventive Care for Adults, Female A healthy lifestyle and preventive care can promote health and wellness. Preventive health guidelines for women include the following key practices.   A routine yearly physical is a good way to check with your health care provider about  your health and preventive screening. It is a chance to share any concerns and updates on your health and to receive a thorough exam.   Visit your dentist for a routine exam and preventive care every 6 months. Brush your teeth twice a day and floss once a day. Good oral hygiene prevents tooth decay and gum disease.   The frequency of eye exams is based on your age, health, family medical history, use of contact lenses, and other factors. Follow your health care provider's recommendations for frequency of eye exams.   Eat a healthy diet. Foods like vegetables, fruits, whole grains, low-fat dairy products, and lean protein foods contain the nutrients you need without too many calories. Decrease your intake of foods high in solid fats, added sugars, and salt. Eat the right amount of calories for you.Get information about a proper diet from your health care provider, if necessary.   Regular physical exercise is one of the most important things you can do for your health. Most adults should get at least 150 minutes of moderate-intensity exercise (any activity that increases your heart rate and causes you to sweat) each week. In addition, most adults need muscle-strengthening exercises on 2 or more days a week.   Maintain a healthy weight. The body mass index (BMI) is a screening tool to identify possible weight problems. It provides an estimate of body fat based on height and weight. Your health care provider can find your BMI, and can help you achieve or maintain a healthy weight.For adults 20 years and older:   - A BMI below 18.5 is considered underweight.   - A BMI of 18.5 to 24.9 is normal.   - A BMI of 25 to 29.9 is considered overweight.   - A BMI of 30 and above is considered obese.   Maintain normal blood lipids and cholesterol levels by exercising and minimizing your intake of trans and saturated fats.  Eat a balanced diet with plenty of fruit and vegetables. Blood tests for lipids and  cholesterol should begin at age 20 and be repeated every 5 years minimum.  If your lipid or cholesterol levels are high, you are over 40, or you are at high risk for heart disease, you may need your cholesterol levels checked more frequently.Ongoing high lipid and cholesterol levels should be treated with medicines if diet and exercise are not working.   If you smoke, find out from your health care provider how to quit. If you do not use tobacco, do not start.   Lung cancer screening is recommended for adults aged 55-80 years who are at high risk for developing lung cancer because of a history of smoking. A yearly low-dose CT scan of the lungs is recommended for people who   have at least a 30-pack-year history of smoking and are a current smoker or have quit within the past 15 years. A pack year of smoking is smoking an average of 1 pack of cigarettes a day for 1 year (for example: 1 pack a day for 30 years or 2 packs a day for 15 years). Yearly screening should continue until the smoker has stopped smoking for at least 15 years. Yearly screening should be stopped for people who develop a health problem that would prevent them from having lung cancer treatment.   If you are pregnant, do not drink alcohol. If you are breastfeeding, be very cautious about drinking alcohol. If you are not pregnant and choose to drink alcohol, do not have more than 1 drink per day. One drink is considered to be 12 ounces (355 mL) of beer, 5 ounces (148 mL) of wine, or 1.5 ounces (44 mL) of liquor.   Avoid use of street drugs. Do not share needles with anyone. Ask for help if you need support or instructions about stopping the use of drugs.   High blood pressure causes heart disease and increases the risk of stroke. Your blood pressure should be checked at least yearly.  Ongoing high blood pressure should be treated with medicines if weight loss and exercise do not work.   If you are 55-79 years old, ask your health  care provider if you should take aspirin to prevent strokes.   Diabetes screening involves taking a blood sample to check your fasting blood sugar level. This should be done once every 3 years, after age 45, if you are within normal weight and without risk factors for diabetes. Testing should be considered at a younger age or be carried out more frequently if you are overweight and have at least 1 risk factor for diabetes.   Breast cancer screening is essential preventive care for women. You should practice "breast self-awareness."  This means understanding the normal appearance and feel of your breasts and may include breast self-examination.  Any changes detected, no matter how small, should be reported to a health care provider.  Women in their 20s and 30s should have a clinical breast exam (CBE) by a health care provider as part of a regular health exam every 1 to 3 years.  After age 40, women should have a CBE every year.  Starting at age 40, women should consider having a mammogram (breast X-ray test) every year.  Women who have a family history of breast cancer should talk to their health care provider about genetic screening.  Women at a high risk of breast cancer should talk to their health care providers about having an MRI and a mammogram every year.   -Breast cancer gene (BRCA)-related cancer risk assessment is recommended for women who have family members with BRCA-related cancers. BRCA-related cancers include breast, ovarian, tubal, and peritoneal cancers. Having family members with these cancers may be associated with an increased risk for harmful changes (mutations) in the breast cancer genes BRCA1 and BRCA2. Results of the assessment will determine the need for genetic counseling and BRCA1 and BRCA2 testing.   The Pap test is a screening test for cervical cancer. A Pap test can show cell changes on the cervix that might become cervical cancer if left untreated. A Pap test is a procedure  in which cells are obtained and examined from the lower end of the uterus (cervix).   - Women should have a Pap test starting at age   21.   - Between ages 21 and 29, Pap tests should be repeated every 2 years.   - Beginning at age 30, you should have a Pap test every 3 years as long as the past 3 Pap tests have been normal.   - Some women have medical problems that increase the chance of getting cervical cancer. Talk to your health care provider about these problems. It is especially important to talk to your health care provider if a new problem develops soon after your last Pap test. In these cases, your health care provider may recommend more frequent screening and Pap tests.   - The above recommendations are the same for women who have or have not gotten the vaccine for human papillomavirus (HPV).   - If you had a hysterectomy for a problem that was not cancer or a condition that could lead to cancer, then you no longer need Pap tests. Even if you no longer need a Pap test, a regular exam is a good idea to make sure no other problems are starting.   - If you are between ages 65 and 70 years, and you have had normal Pap tests going back 10 years, you no longer need Pap tests. Even if you no longer need a Pap test, a regular exam is a good idea to make sure no other problems are starting.   - If you have had past treatment for cervical cancer or a condition that could lead to cancer, you need Pap tests and screening for cancer for at least 20 years after your treatment.   - If Pap tests have been discontinued, risk factors (such as a new sexual partner) need to be reassessed to determine if screening should be resumed.   - The HPV test is an additional test that may be used for cervical cancer screening. The HPV test looks for the virus that can cause the cell changes on the cervix. The cells collected during the Pap test can be tested for HPV. The HPV test could be used to screen women aged 30  years and older, and should be used in women of any age who have unclear Pap test results. After the age of 30, women should have HPV testing at the same frequency as a Pap test.   Colorectal cancer can be detected and often prevented. Most routine colorectal cancer screening begins at the age of 50 years and continues through age 75 years. However, your health care provider may recommend screening at an earlier age if you have risk factors for colon cancer. On a yearly basis, your health care provider may provide home test kits to check for hidden blood in the stool.  Use of a small camera at the end of a tube, to directly examine the colon (sigmoidoscopy or colonoscopy), can detect the earliest forms of colorectal cancer. Talk to your health care provider about this at age 50, when routine screening begins. Direct exam of the colon should be repeated every 5 -10 years through age 75 years, unless early forms of pre-cancerous polyps or small growths are found.   People who are at an increased risk for hepatitis B should be screened for this virus. You are considered at high risk for hepatitis B if:  -You were born in a country where hepatitis B occurs often. Talk with your health care provider about which countries are considered high risk.  - Your parents were born in a high-risk country and you have not   received a shot to protect against hepatitis B (hepatitis B vaccine).  - You have HIV or AIDS.  - You use needles to inject street drugs.  - You live with, or have sex with, someone who has Hepatitis B.  - You get hemodialysis treatment.  - You take certain medicines for conditions like cancer, organ transplantation, and autoimmune conditions.   Hepatitis C blood testing is recommended for all people born from 1945 through 1965 and any individual with known risks for hepatitis C.   Practice safe sex. Use condoms and avoid high-risk sexual practices to reduce the spread of sexually transmitted  infections (STIs). STIs include gonorrhea, chlamydia, syphilis, trichomonas, herpes, HPV, and human immunodeficiency virus (HIV). Herpes, HIV, and HPV are viral illnesses that have no cure. They can result in disability, cancer, and death. Sexually active women aged 25 years and younger should be checked for chlamydia. Older women with new or multiple partners should also be tested for chlamydia. Testing for other STIs is recommended if you are sexually active and at increased risk.   Osteoporosis is a disease in which the bones lose minerals and strength with aging. This can result in serious bone fractures or breaks. The risk of osteoporosis can be identified using a bone density scan. Women ages 65 years and over and women at risk for fractures or osteoporosis should discuss screening with their health care providers. Ask your health care provider whether you should take a calcium supplement or vitamin D to reduce the rate of osteoporosis.   Menopause can be associated with physical symptoms and risks. Hormone replacement therapy is available to decrease symptoms and risks. You should talk to your health care provider about whether hormone replacement therapy is right for you.   Use sunscreen. Apply sunscreen liberally and repeatedly throughout the day. You should seek shade when your shadow is shorter than you. Protect yourself by wearing long sleeves, pants, a wide-brimmed hat, and sunglasses year round, whenever you are outdoors.   Once a month, do a whole body skin exam, using a mirror to look at the skin on your back. Tell your health care provider of new moles, moles that have irregular borders, moles that are larger than a pencil eraser, or moles that have changed in shape or color.   Stay current with required vaccines (immunizations).   Influenza vaccine. All adults should be immunized every year.   Tetanus, diphtheria, and acellular pertussis (Td, Tdap) vaccine. Pregnant women  should receive 1 dose of Tdap vaccine during each pregnancy. The dose should be obtained regardless of the length of time since the last dose. Immunization is preferred during the 27th 36th week of gestation. An adult who has not previously received Tdap or who does not know her vaccine status should receive 1 dose of Tdap. This initial dose should be followed by tetanus and diphtheria toxoids (Td) booster doses every 10 years. Adults with an unknown or incomplete history of completing a 3-dose immunization series with Td-containing vaccines should begin or complete a primary immunization series including a Tdap dose. Adults should receive a Td booster every 10 years.   Varicella vaccine. An adult without evidence of immunity to varicella should receive 2 doses or a second dose if she has previously received 1 dose. Pregnant females who do not have evidence of immunity should receive the first dose after pregnancy. This first dose should be obtained before leaving the health care facility. The second dose should be obtained 4 8 weeks   after the first dose.   Human papillomavirus (HPV) vaccine. Females aged 13 26 years who have not received the vaccine previously should obtain the 3-dose series. The vaccine is not recommended for use in pregnant females. However, pregnancy testing is not needed before receiving a dose. If a female is found to be pregnant after receiving a dose, no treatment is needed. In that case, the remaining doses should be delayed until after the pregnancy. Immunization is recommended for any person with an immunocompromised condition through the age of 26 years if she did not get any or all doses earlier. During the 3-dose series, the second dose should be obtained 4 8 weeks after the first dose. The third dose should be obtained 24 weeks after the first dose and 16 weeks after the second dose.   Zoster vaccine. One dose is recommended for adults aged 60 years or older unless certain  conditions are present.   Measles, mumps, and rubella (MMR) vaccine. Adults born before 1957 generally are considered immune to measles and mumps. Adults born in 1957 or later should have 1 or more doses of MMR vaccine unless there is a contraindication to the vaccine or there is laboratory evidence of immunity to each of the three diseases. A routine second dose of MMR vaccine should be obtained at least 28 days after the first dose for students attending postsecondary schools, health care workers, or international travelers. People who received inactivated measles vaccine or an unknown type of measles vaccine during 1963 1967 should receive 2 doses of MMR vaccine. People who received inactivated mumps vaccine or an unknown type of mumps vaccine before 1979 and are at high risk for mumps infection should consider immunization with 2 doses of MMR vaccine. For females of childbearing age, rubella immunity should be determined. If there is no evidence of immunity, females who are not pregnant should be vaccinated. If there is no evidence of immunity, females who are pregnant should delay immunization until after pregnancy. Unvaccinated health care workers born before 1957 who lack laboratory evidence of measles, mumps, or rubella immunity or laboratory confirmation of disease should consider measles and mumps immunization with 2 doses of MMR vaccine or rubella immunization with 1 dose of MMR vaccine.   Pneumococcal 13-valent conjugate (PCV13) vaccine. When indicated, a person who is uncertain of her immunization history and has no record of immunization should receive the PCV13 vaccine. An adult aged 19 years or older who has certain medical conditions and has not been previously immunized should receive 1 dose of PCV13 vaccine. This PCV13 should be followed with a dose of pneumococcal polysaccharide (PPSV23) vaccine. The PPSV23 vaccine dose should be obtained at least 8 weeks after the dose of PCV13 vaccine. An  adult aged 19 years or older who has certain medical conditions and previously received 1 or more doses of PPSV23 vaccine should receive 1 dose of PCV13. The PCV13 vaccine dose should be obtained 1 or more years after the last PPSV23 vaccine dose.   Pneumococcal polysaccharide (PPSV23) vaccine. When PCV13 is also indicated, PCV13 should be obtained first. All adults aged 65 years and older should be immunized. An adult younger than age 65 years who has certain medical conditions should be immunized. Any person who resides in a nursing home or long-term care facility should be immunized. An adult smoker should be immunized. People with an immunocompromised condition and certain other conditions should receive both PCV13 and PPSV23 vaccines. People with human immunodeficiency virus (HIV) infection should   be immunized as soon as possible after diagnosis. Immunization during chemotherapy or radiation therapy should be avoided. Routine use of PPSV23 vaccine is not recommended for American Indians, [place] Natives, or people younger than 65 years unless there are medical conditions that require PPSV23 vaccine. When indicated, people who have unknown immunization and have no record of immunization should receive PPSV23 vaccine. One-time revaccination 5 years after the first dose of PPSV23 is recommended for people aged 19 64 years who have chronic kidney failure, nephrotic syndrome, asplenia, or immunocompromised conditions. People who received 1 2 doses of PPSV23 before age 65 years should receive another dose of PPSV23 vaccine at age 65 years or later if at least 5 years have passed since the previous dose. Doses of PPSV23 are not needed for people immunized with PPSV23 at or after age 65 years.   Meningococcal vaccine. Adults with asplenia or persistent complement component deficiencies should receive 2 doses of quadrivalent meningococcal conjugate (MenACWY-D) vaccine. The doses should be obtained at least 2 months  apart. Microbiologists working with certain meningococcal bacteria, military recruits, people at risk during an outbreak, and people who travel to or live in countries with a high rate of meningitis should be immunized. A first-year college student up through age 21 years who is living in a residence hall should receive a dose if she did not receive a dose on or after her 16th birthday. Adults who have certain high-risk conditions should receive one or more doses of vaccine.   Hepatitis A vaccine. Adults who wish to be protected from this disease, have certain high-risk conditions, work with hepatitis A-infected animals, work in hepatitis A research labs, or travel to or work in countries with a high rate of hepatitis A should be immunized. Adults who were previously unvaccinated and who anticipate close contact with an international adoptee during the first 60 days after arrival in the [place] from a country with a high rate of hepatitis A should be immunized.   Hepatitis B vaccine. Adults who wish to be protected from this disease, have certain high-risk conditions, may be exposed to blood or other infectious body fluids, are household contacts or sex partners of hepatitis B positive people, are clients or workers in certain care facilities, or travel to or work in countries with a high rate of hepatitis B should be immunized.   Haemophilus influenzae type b (Hib) vaccine. A previously unvaccinated person with asplenia or sickle cell disease or having a scheduled splenectomy should receive 1 dose of Hib vaccine. Regardless of previous immunization, a recipient of a hematopoietic stem cell transplant should receive a 3-dose series 6 12 months after her successful transplant. Hib vaccine is not recommended for adults with HIV infection.  Preventive Services / Frequency Ages 19 to 39years  Blood pressure check.** / Every 1 to 2 years.  Lipid and cholesterol check.** / Every 5 years beginning at  age 20.  Clinical breast exam.** / Every 3 years for women in their 20s and 30s.  BRCA-related cancer risk assessment.** / For women who have family members with a BRCA-related cancer (breast, ovarian, tubal, or peritoneal cancers).  Pap test.** / Every 2 years from ages 21 through 29. Every 3 years starting at age 30 through age 65 or 70 with a history of 3 consecutive normal Pap tests.  HPV screening.** / Every 3 years from ages 30 through ages 65 to 70 with a history of 3 consecutive normal Pap tests.  Hepatitis C blood test.** /   For any individual with known risks for hepatitis C.  Skin self-exam. / Monthly.  Influenza vaccine. / Every year.  Tetanus, diphtheria, and acellular pertussis (Tdap, Td) vaccine.** / Consult your health care provider. Pregnant women should receive 1 dose of Tdap vaccine during each pregnancy. 1 dose of Td every 10 years.  Varicella vaccine.** / Consult your health care provider. Pregnant females who do not have evidence of immunity should receive the first dose after pregnancy.  HPV vaccine. / 3 doses over 6 months, if 26 and younger. The vaccine is not recommended for use in pregnant females. However, pregnancy testing is not needed before receiving a dose.  Measles, mumps, rubella (MMR) vaccine.** / You need at least 1 dose of MMR if you were born in 1957 or later. You may also need a 2nd dose. For females of childbearing age, rubella immunity should be determined. If there is no evidence of immunity, females who are not pregnant should be vaccinated. If there is no evidence of immunity, females who are pregnant should delay immunization until after pregnancy.  Pneumococcal 13-valent conjugate (PCV13) vaccine.** / Consult your health care provider.  Pneumococcal polysaccharide (PPSV23) vaccine.** / 1 to 2 doses if you smoke cigarettes or if you have certain conditions.  Meningococcal vaccine.** / 1 dose if you are age 19 to 21 years and a first-year  college student living in a residence hall, or have one of several medical conditions, you need to get vaccinated against meningococcal disease. You may also need additional booster doses.  Hepatitis A vaccine.** / Consult your health care provider.  Hepatitis B vaccine.** / Consult your health care provider.  Haemophilus influenzae type b (Hib) vaccine.** / Consult your health care provider.  Ages 40 to 64years  Blood pressure check.** / Every 1 to 2 years.  Lipid and cholesterol check.** / Every 5 years beginning at age 20 years.  Lung cancer screening. / Every year if you are aged 55 80 years and have a 30-pack-year history of smoking and currently smoke or have quit within the past 15 years. Yearly screening is stopped once you have quit smoking for at least 15 years or develop a health problem that would prevent you from having lung cancer treatment.  Clinical breast exam.** / Every year after age 40 years.  BRCA-related cancer risk assessment.** / For women who have family members with a BRCA-related cancer (breast, ovarian, tubal, or peritoneal cancers).  Mammogram.** / Every year beginning at age 40 years and continuing for as long as you are in good health. Consult with your health care provider.  Pap test.** / Every 3 years starting at age 30 years through age 65 or 70 years with a history of 3 consecutive normal Pap tests.  HPV screening.** / Every 3 years from ages 30 years through ages 65 to 70 years with a history of 3 consecutive normal Pap tests.  Fecal occult blood test (FOBT) of stool. / Every year beginning at age 50 years and continuing until age 75 years. You may not need to do this test if you get a colonoscopy every 10 years.  Flexible sigmoidoscopy or colonoscopy.** / Every 5 years for a flexible sigmoidoscopy or every 10 years for a colonoscopy beginning at age 50 years and continuing until age 75 years.  Hepatitis C blood test.** / For all people born from  1945 through 1965 and any individual with known risks for hepatitis C.  Skin self-exam. / Monthly.  Influenza vaccine. /   Every year.  Tetanus, diphtheria, and acellular pertussis (Tdap/Td) vaccine.** / Consult your health care provider. Pregnant women should receive 1 dose of Tdap vaccine during each pregnancy. 1 dose of Td every 10 years.  Varicella vaccine.** / Consult your health care provider. Pregnant females who do not have evidence of immunity should receive the first dose after pregnancy.  Zoster vaccine.** / 1 dose for adults aged 60 years or older.  Measles, mumps, rubella (MMR) vaccine.** / You need at least 1 dose of MMR if you were born in 1957 or later. You may also need a 2nd dose. For females of childbearing age, rubella immunity should be determined. If there is no evidence of immunity, females who are not pregnant should be vaccinated. If there is no evidence of immunity, females who are pregnant should delay immunization until after pregnancy.  Pneumococcal 13-valent conjugate (PCV13) vaccine.** / Consult your health care provider.  Pneumococcal polysaccharide (PPSV23) vaccine.** / 1 to 2 doses if you smoke cigarettes or if you have certain conditions.  Meningococcal vaccine.** / Consult your health care provider.  Hepatitis A vaccine.** / Consult your health care provider.  Hepatitis B vaccine.** / Consult your health care provider.  Haemophilus influenzae type b (Hib) vaccine.** / Consult your health care provider.  Ages 65 years and over  Blood pressure check.** / Every 1 to 2 years.  Lipid and cholesterol check.** / Every 5 years beginning at age 20 years.  Lung cancer screening. / Every year if you are aged 55 80 years and have a 30-pack-year history of smoking and currently smoke or have quit within the past 15 years. Yearly screening is stopped once you have quit smoking for at least 15 years or develop a health problem that would prevent you from having lung  cancer treatment.  Clinical breast exam.** / Every year after age 40 years.  BRCA-related cancer risk assessment.** / For women who have family members with a BRCA-related cancer (breast, ovarian, tubal, or peritoneal cancers).  Mammogram.** / Every year beginning at age 40 years and continuing for as long as you are in good health. Consult with your health care provider.  Pap test.** / Every 3 years starting at age 30 years through age 65 or 70 years with 3 consecutive normal Pap tests. Testing can be stopped between 65 and 70 years with 3 consecutive normal Pap tests and no abnormal Pap or HPV tests in the past 10 years.  HPV screening.** / Every 3 years from ages 30 years through ages 65 or 70 years with a history of 3 consecutive normal Pap tests. Testing can be stopped between 65 and 70 years with 3 consecutive normal Pap tests and no abnormal Pap or HPV tests in the past 10 years.  Fecal occult blood test (FOBT) of stool. / Every year beginning at age 50 years and continuing until age 75 years. You may not need to do this test if you get a colonoscopy every 10 years.  Flexible sigmoidoscopy or colonoscopy.** / Every 5 years for a flexible sigmoidoscopy or every 10 years for a colonoscopy beginning at age 50 years and continuing until age 75 years.  Hepatitis C blood test.** / For all people born from 1945 through 1965 and any individual with known risks for hepatitis C.  Osteoporosis screening.** / A one-time screening for women ages 65 years and over and women at risk for fractures or osteoporosis.  Skin self-exam. / Monthly.  Influenza vaccine. / Every year.    Tetanus, diphtheria, and acellular pertussis (Tdap/Td) vaccine.** / 1 dose of Td every 10 years.  Varicella vaccine.** / Consult your health care provider.  Zoster vaccine.** / 1 dose for adults aged 60 years or older.  Pneumococcal 13-valent conjugate (PCV13) vaccine.** / Consult your health care provider.  Pneumococcal  polysaccharide (PPSV23) vaccine.** / 1 dose for all adults aged 65 years and older.  Meningococcal vaccine.** / Consult your health care provider.  Hepatitis A vaccine.** / Consult your health care provider.  Hepatitis B vaccine.** / Consult your health care provider.  Haemophilus influenzae type b (Hib) vaccine.** / Consult your health care provider.     ** Family history and personal history of risk and conditions may change your health care provider's recommendations. Document Released: 08/25/2001 Document Revised: 04/19/2013  [employer] Patient Information 2014 [employer]   EXERCISE AND DIET:  We recommended that you start or continue a regular exercise program for good health. Regular exercise means any activity that makes your heart beat faster and makes you sweat.  We recommend exercising at least 30 minutes per day at least 3 days a week, preferably 5.  We also recommend a diet low in fat and sugar / carbohydrates.  Inactivity, poor dietary choices and obesity can cause diabetes, heart attack, stroke, and kidney damage, among others.     ALCOHOL AND SMOKING:  Women should limit their alcohol intake to no more than 7 drinks/beers/glasses of wine (combined, not each!) per week. Moderation of alcohol intake to this level decreases your risk of breast cancer and liver damage.  ( And of course, no recreational drugs are part of a healthy lifestyle.)  Also, you should not be smoking at all or even being exposed to second hand smoke. Most people know smoking can cause cancer, and various heart and lung diseases, but did you know it also contributes to weakening of your bones?  Aging of your skin?  Yellowing of your teeth and nails?   CALCIUM AND VITAMIN D:  Adequate intake of calcium and Vitamin D are recommended.  The recommendations for exact amounts of these supplements seem to change often, but generally speaking 600 mg of calcium (either carbonate or citrate) and 800 units of  Vitamin D per day seems prudent. Certain women may benefit from higher intake of Vitamin D.  If you are among these women, your doctor will have told you during your visit.     PAP SMEARS:  Pap smears, to check for cervical cancer or precancers,  have traditionally been done yearly, although recent scientific advances have shown that most women can have pap smears less often.  However, every woman still should have a physical exam from her gynecologist or primary care physician every year. It will include a breast check, inspection of the vulva and vagina to check for abnormal growths or skin changes, a visual exam of the cervix, and then an exam to evaluate the size and shape of the uterus and ovaries.  And after 27 years of age, a rectal exam is indicated to check for rectal cancers. We will also provide age appropriate advice regarding health maintenance, like when you should have certain vaccines, screening for sexually transmitted diseases, bone density testing, colonoscopy, mammograms, etc.    MAMMOGRAMS:  All women over 40 years old should have a yearly mammogram. Many facilities now offer a "3D" mammogram, which may cost around $50 extra out of pocket. If possible,  we recommend you accept the option to have the   3D mammogram performed.  It both reduces the number of women who will be called back for extra views which then turn out to be normal, and it is better than the routine mammogram at detecting truly abnormal areas.     COLONOSCOPY:  Colonoscopy to screen for colon cancer is recommended for all women at age 50.  We know, you hate the idea of the prep.  We agree, BUT, having colon cancer and not knowing it is worse!!  Colon cancer so often starts as a polyp that can be seen and removed at colonscopy, which can quite literally save your life!  And if your first colonoscopy is normal and you have no family history of colon cancer, most women don't have to have it again for 10 years.  Once  every ten years, you can do something that may end up saving your life, right?  We will be happy to help you get it scheduled when you are ready.  Be sure to check your insurance coverage so you understand how much it will cost.  It may be covered as a preventative service at no cost, but you should check your particular policy.   

## 2016-05-12 NOTE — Progress Notes (Signed)
Impression and Recommendations:    1. Encounter for wellness examination   2. Screening for cervical cancer   3. Counseling on health promotion and disease prevention   4. Obesity, Class II, BMI 35-39.9, with comorbidity   5. Ringworm of body    Ringworm: change in therapy- DC Lamisil. Start Loprox. Importance of every day use and keeping the area dry and open to air as much as possible discussed.   Gross side effects, risk and benefits, and alternatives of medications discussed with patient.  Patient is aware that all medications have potential side effects and we are unable to predict every side effect or drug-drug interaction that may occur.  Expresses verbal understanding and consents to current therapy plan and treatment regiment.  1) Anticipatory Guidance: Discussed importance of wearing a seatbelt while driving, not texting while driving; sunscreen when outside along with yearly skin surveillance; eating a well balanced and modest diet; physical activity at least 25 minutes per day or 150 min/ week of moderate to intense activity.  2) Immunizations / Screenings / Labs:  All immunizations and screenings that patient agrees to, are up-to-date per recommendations or will be updated today.  Patient declines.  Patient understands the needs for q 41mo dental and yearly vision screens which pt will schedule independently.  3) Weight:   Discussed goal of losing even 5-10% of current body weight which would improve overall feelings of well being and improve objective health data significantly.   Improve nutrient density of diet through increasing intake of fruits and vegetables and decreasing saturated/trans fats, white flour products and refined sugar products.   F-up preventative CPE in 1 year. F/up sooner for chronic care management as discussed and/or prn.  Please see orders placed and AVS handed out to patient at the end of our visit for further patient instructions/ counseling done  pertaining to today's office visit.    Subjective:    Chief Complaint  Patient presents with  . Annual Exam   HPI: Carolyn Morris is a 25 y.o. female who [facility] a [facility] health maintenance exam.     - Recent labs drawn on 10\2\17 were briefly reviewed with patient.    Health Maintenance Summary Reviewed and updated, unless pt declines services. 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  Fluorescent treponemal ab(fta)-IgG-bld test negative.  Drug Use:     None Birth control method:   Condoms always, except oral sex.  Menses regular:      yes Lumps or breast concerns:      no  09/20/13--> was last PAP smear.   No new sexual partners since last pap--> with each other for 1.5 yrs and they recently broke up.   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 All questions answered   Wt Readings from Last 3 Encounters:  05/12/16 215 lb 9.6 oz (97.8 kg)  04/13/16 225 lb (102.1 kg)  12/08/14 203 lb 8 oz (92.3 kg)   BP Readings from Last 3 Encounters:  05/12/16 102/70  04/13/16 111/77  12/08/14 108/72   Pulse Readings from Last 3 Encounters:  05/12/16 89  04/13/16 84  12/08/14 91    History reviewed. No pertinent past medical history.   History reviewed. No pertinent surgical history.  Family History  Problem Relation Age of Onset  . Hyperlipidemia Mother   . Diabetes Mother   . Diabetes Maternal Grandmother   . Cancer Paternal Aunt     skin cancer  . Diabetes Paternal Aunt       History  Drug Use No  ,   History  Alcohol Use  . 0.0 oz/week  ,   History  Smoking Status  . Never Smoker  Smokeless Tobacco  . Never Used  ,   History  Sexual Activity  . Sexual activity: No    Current Outpatient Prescriptions on File Prior to Visit  Medication Sig Dispense  Refill  . Vitamin D, Ergocalciferol, (DRISDOL) 50000 units CAPS capsule Take 1 capsule (50,000 Units total) by mouth every 7 (seven) days. 12 capsule 3   No current facility-administered medications on file prior to visit.     Allergies: Review of patient's allergies indicates no known allergies.  Review of Systems  Constitutional: Negative.  Negative for chills, diaphoresis, fever, malaise/fatigue and weight loss.  HENT: Negative.  Negative for congestion, sore throat and tinnitus.   Eyes: Negative.  Negative for blurred vision, double vision and photophobia.  Respiratory: Negative.  Negative for cough and wheezing.   Cardiovascular: Negative.  Negative for chest pain and palpitations.  Gastrointestinal: Negative.  Negative for blood in stool, diarrhea, nausea and vomiting.  Genitourinary: Negative.  Negative for dysuria, frequency and urgency.  Musculoskeletal: Negative.  Negative for joint pain and myalgias.  Skin: Negative.  Negative for itching and rash.  Neurological: Negative.  Negative for dizziness, focal weakness, weakness and headaches.  Endo/Heme/Allergies: Negative.  Negative for environmental allergies and polydipsia. Does not bruise/bleed easily.  Psychiatric/Behavioral: Negative.  Negative for depression and memory loss. The patient is not nervous/anxious and does not have insomnia.      Objective:    Blood pressure 102/70, pulse 89, height 5\' 2"  (1.575 m), weight 215 lb 9.6 oz (97.8 kg), last menstrual period 04/24/2016. Body mass index is 39.43 kg/m.   General Appear:    Alert, cooperative, no distress, appears stated age    Head:    Normocephalic, without obvious abnormality, atraumatic   Eyes:    PERRL, conjunctiva/corneas clear, EOM's intact, fundi    benign, both eyes   Ears:    Normal TM's and external ear canals, both ears   Nose:   Nares normal, septum midline, mucosa normal, no drainage    or sinus tenderness   Throat:   Lips w/o lesion, mucosa  moist, and tongue normal; teeth and   gums normal   Neck:   Supple, symmetrical, trachea midline, no adenopathy;    thyroid:  no enlargement/tenderness/nodules; no carotid   bruit or JVD   Back:     Symmetric, no curvature, ROM normal, no CVA tenderness   Lungs:     Clear to auscultation bilaterally, respirations unlabored, no Wh/ R/ R   Chest Wall:    No tenderness or gross deformity; normal excursion     Heart:    Regular rate and rhythm, S1 and S2 normal, no murmur, rub   or gallop   Breast Exam:    No tenderness, masses, or nipple abnormality b/l; no d/c    Abdomen:     Soft, non-tender, bowel sounds active all four quadrants, NO G/R/R, no masses, no organomegaly   Genitalia:    Ext genitalia: without lesion, no rash or discharge, No tenderness;  Cervix: WNL's w/o discharge or lesion;  Adnexa:  No tenderness or palpable masses    Rectal:    Deferred by patient   Extremity:   Extremities normal, atraumatic, no cyanosis or gross edema   Pulses:   2+ and symmetric all extremities   Skin:   Warm, dry, Skin color, texture, turgor normal, no obvious rashes or lesions; ringworm lesion approximately 1 inch and a half diameter left upper, inner thigh.  Psych: No HI/SI, judgement and insight good, Euthymic mood. Full Affect.   Neurologic:   CNII-XII intact, normal strength, sensation and reflexes    Throughout

## 2016-05-14 LAB — CYTOLOGY - PAP
Chlamydia: NEGATIVE
DIAGNOSIS: NEGATIVE
Neisseria Gonorrhea: NEGATIVE
TRICH (WINDOWPATH): NEGATIVE

## 2016-05-15 ENCOUNTER — Other Ambulatory Visit: Payer: Self-pay

## 2016-05-15 MED ORDER — FLUCONAZOLE 150 MG PO TABS: ORAL_TABLET | ORAL | 0 refills | Status: DC

## 2016-05-18 LAB — CERVICOVAGINAL ANCILLARY ONLY
Bacterial vaginitis: NEGATIVE
Candida vaginitis: NEGATIVE

## 2017-01-06 ENCOUNTER — Telehealth: Payer: Self-pay

## 2017-01-06 NOTE — Telephone Encounter (Signed)
Patient walked in wanting an appointment to discuss anxiety.  Patient denies suicidal thoughts/harming self or others.  Advised patient she would need to schedule an appointment as we had not opening at that time.  Patient was understanding and made appointment.  Informed patient that in the future she needs to always have an appointment.

## 2017-01-19 ENCOUNTER — Encounter: Payer: Self-pay | Admitting: Family Medicine

## 2017-01-19 ENCOUNTER — Ambulatory Visit (INDEPENDENT_AMBULATORY_CARE_PROVIDER_SITE_OTHER): Payer: BLUE CROSS/BLUE SHIELD | Admitting: Family Medicine

## 2017-01-19 VITALS — BP 115/77 | HR 88 | Ht 62.0 in | Wt 224.8 lb

## 2017-01-19 DIAGNOSIS — G479 Sleep disorder, unspecified: Secondary | ICD-10-CM

## 2017-01-19 DIAGNOSIS — R5383 Other fatigue: Secondary | ICD-10-CM

## 2017-01-19 DIAGNOSIS — F4321 Adjustment disorder with depressed mood: Secondary | ICD-10-CM

## 2017-01-19 MED ORDER — FLUOXETINE HCL 20 MG PO CAPS: 20.0000 mg | ORAL_CAPSULE | Freq: Every day | ORAL | 0 refills | Status: DC

## 2017-01-19 NOTE — Patient Instructions (Signed)
If you're interested in going to a dietary counselor\ dietitian please let us know we are happy to refer you.   Please call your insurance company to cross [facility] and find out who is in your network and what your co-pays will be     [facility]   [facility] Caralyn Guileavid Gutterman, PhD [address], [place] [phone]  [facility] [address], [address], [place] [phone]  Heloise BeechamAlicia Brown-Licensed Professional Counselor Counseling and [facility] [phone]  [facility] Mac[facility]enzie-Substance abuse [facility] Taylor-Clinical Manager [address], [place] [phone]  [facility] [address], [place] [phone] NineMichie Dew, PhD [place] ScrapeStuart Hunt, PhD Hollace Kinnierlair Huprich, LCSW Andrena MewsJennifer Sommer, PhD-child, adolescent and adults  [facility] [address], [place] [phone] Daun PeacockSara DeHart-Young, MS-child, adolescent and adults Madelaine EtienneKatherine Glenn, PhD-adolescent and adults  [facility], terminal illness [address], [place] [phone]  [facility] [address], [place] [phone] [facility] [address] [place] [phone]  [facility] [address], [place] [phone]  [facility] [address], [address], [place] [phone]  [facility] [address], [place] [phone]  [facility] [address], [place] [phone]  [facility] [address], [address], [place] [phone]   Behavior Modification Ideas for Weight Management  Weight management involves adopting a healthy lifestyle that includes a knowledge of nutrition and exercise, a positive attitude and  the right kind of motivation. Internal motives such as better health, increased energy, self-esteem and personal control increase your chances of lifelong weight management success.  Remember to have realistic goals and think long-term success. Believe in yourself and you can do it. The following information will give you ideas to help you meet your goals.  Control Your Home Environment  Eat only while sitting down at the kitchen or dining room table. Do not eat while watching television, reading, cooking, talking on the phone, standing at the refrigerator or working on the computer. Keep tempting foods out of the house - don't buy them. Keep tempting foods out of sight. Have low-calorie foods ready to eat. Unless you are preparing a meal, stay out of the kitchen. Have healthy snacks at your disposal, such as small pieces of fruit, vegetables, canned fruit, pretzels, low-fat string cheese and nonfat cottage cheese.  Control Your Work Environment  Do not eat at [employer]your desk or keep tempting snacks at your desk. If you get hungry between meals, plan healthy snacks and bring them with you to work. During your breaks, go for a walk instead of eating. If you work around food, plan in advance the one item you will eat at mealtime. Make it inconvenient to nibble on food by chewing gum, sugarless candy or drinking water or another low-calorie beverage. Do not work through meals. Skipping meals slows down metabolism and may result in overeating at the next meal. If food is available for special occasions, either pick the healthiest item, nibble on low-fat snacks brought from home, don't have anything offered, choose one option and have a small amount, or have only a beverage.  Control Your Mealtime Environment  Serve your plate of food at the stove or kitchen counter. Do not put the serving dishes on the table. If you do put dishes on the table, remove them immediately when finished eating. Fill half  of  your plate with vegetables, a quarter with lean protein and a quarter with starch. Use smaller plates, bowls and glasses. A smaller portion will look large when it is in a little dish. Politely refuse second helpings. When fixing your plate, limit portions of food to one scoop/serving or less.   Daily Food Management  Replace eating with another activity that you will not associate with food. Wait 20 minutes before eating something you are craving. Drink a large glass of water or diet soda before eating. Always have a big glass or bottle of water to drink throughout the day. Avoid high-calorie add-ons such as cream with your coffee, butter, mayonnaise and salad dressings.  Shopping: Do not shop when hungry or tired. Shop from a list and avoid buying anything that is not on your list. If you must have tempting foods, buy individual-sized packages and try to find a lower-calorie alternative. Don't taste test in the store. Read food labels. Compare products to help you make the healthiest choices.  Preparation: Chew a piece of gum while cooking meals. Use a quarter teaspoon if you taste test your food. Try to only fix what you are going to eat, leaving yourself no chance for seconds. If you have prepared more food than you need, portion it into individual containers and freeze or refrigerate immediately. Don't snack while cooking meals.  Eating: Eat slowly. Remember it takes about 20 minutes for your stomach to send a message to your brain that it is full. Don't let fake hunger make you think you need more. The ideal way to eat is to take a bite, put your utensil down, take a sip of water, cut your next bite, take a bit, put your utensil down and so on. Do not cut your food all at one time. Cut only as needed. Take small bites and chew your food well. Stop eating for a minute or two at least once during a meal or snack. Take breaks to reflect and have conversation.  Cleanup and  Leftovers: Label leftovers for a specific meal or snack. Freeze or refrigerate individual portions of leftovers. Do not clean up if you are still hungry.  Eating Out and Social Eating  Do not arrive hungry. Eat something light before the meal. Try to fill up on low-calorie foods, such as vegetables and fruit, and eat smaller portions of the high-calorie foods. Eat foods that you like, but choose small portions. If you want seconds, wait at least 20 minutes after you have eaten to see if you are actually hungry or if your eyes are bigger than your stomach. Limit alcoholic beverages. Try a soda water with a twist of lime. Do not skip other meals in the day to save room for the special event.  At Restaurants: Order  la carte rather than buffet style. Order some vegetables or a salad for an appetizer instead of eating bread. If you order a high-calorie dish, share it with someone. Try an after-dinner mint with your coffee. If you do have dessert, share it with two or more people. Don't overeat because you do not want to waste food. Ask for a doggie bag to take extra food home. Tell the server to put half of your entree in a to go bag before the meal is served to you. Ask for salad dressing, gravy or high-fat sauces on the side. Dip the tip of your fork in the dressing before each bite. If bread is served, ask for only  one piece. Try it plain without butter or oil. At [employer] where oil and vinegar is served with bread, use only a small amount of oil and a lot of vinegar for dipping.  At a Friend's House: Offer to bring a dish, appetizer or dessert that is low in calories. Serve yourself small portions or tell the host that you only want a small amount. Stand or sit away from the snack table. Stay away from the kitchen or stay busy if you are near the food. Limit your alcohol intake.  At [employer] and Cafeterias: Cover most of your plate with lettuce and/or vegetables. Use a  salad plate instead of a dinner plate. After eating, clear away your dishes before having coffee or tea.  Entertaining at Home: Explore low-fat, low-cholesterol cookbooks. Use single-serving foods like chicken breasts or hamburger patties. Prepare low-calorie appetizers and desserts.   Holidays: Keep tempting foods out of sight. Decorate the house without using food. Have low-calorie beverages and foods on hand for guests. Allow yourself one planned treat a day. Don't skip meals to save up for the holiday feast. Eat regular, planned meals.   Exercise Well  Make exercise a priority and a planned activity in the day. If possible, walk the entire or part of the distance to work. Get an exercise buddy. Go for a walk with a colleague during one of your breaks, go to the gym, run or take a walk with a friend, walk in the mall with a shopping companion. Park at the end of the parking lot and walk to the store or office entrance. Always take the stairs all of the way or at least part of the way to your floor. If you have a desk job, walk around the office frequently. Do leg lifts while sitting at your desk. Do something outside on the weekends like going for a hike or a bike ride.   Have a Healthy Attitude  Make health your weight management priority. Be realistic. Have a goal to achieve a healthier you, not necessarily the lowest weight or ideal weight based on calculations or tables. Focus on a healthy eating style, not on dieting. Dieting usually lasts for a short amount of time and rarely produces long-term success. Think long term. You are developing new healthy behaviors to follow next month, in a year and in a decade.    This information is for educational purposes only and is not intended to replace the advice of your doctor or health care provider. We encourage you to discuss with your doctor any questions or concerns you may have.        Guidelines for Losing  Weight   We want weight loss that will last so you should lose 1-2 pounds a week.  THAT IS IT! Please pick THREE things a month to change. Once it is a habit check off the item. Then pick another three items off the list to become habits.  If you are already doing a habit on the list GREAT!  Cross that item off!  Don't drink your calories. Ie, alcohol, soda, fruit juice, and sweet tea.   Drink more water. Drink a glass when you feel hungry or before each meal.   Eat breakfast - Complex carb and protein (likeDannon light and fit yogurt, oatmeal, fruit, eggs, [place] bacon).  Measure your cereal.  Eat no more than one cup a day. (ie Kashi)  Eat an apple a day.  Add a vegetable a day.  Try a new vegetable a month.  Use Pam! Stop using oil or butter to cook.  Don't finish your plate or use smaller plates.  Share your dessert.  Eat sugar free Jello for dessert or frozen grapes.  Don't eat 2-3 hours before bed.  Switch to whole wheat bread, pasta, and brown rice.  Make healthier choices when you eat out. No fries!  Pick baked chicken, NOT fried.  Don't forget to SLOW DOWN when you eat. It is not going anywhere.   Take the stairs.  Park far away in the parking lot  Lift soup cans (or weights) for 10 minutes while watching TV.  Walk at work for 10 minutes during break.  Walk outside 1 time a week with your friend, kids, dog, or significant other.  Start a walking group at church.  Walk the mall as much as you can tolerate.   Keep a food diary.  Weigh yourself daily.  Walk for 15 minutes 3 days per week.  Cook at home more often and eat out less. If life happens and you go back to old habits, it is okay.  Just start over. You can do it!  If you experience chest pain, get short of breath, or tired during the exercise, please stop immediately and inform your doctor.    Before you even begin to attack a weight-loss plan, it pays to remember this: You are not fat. You  have fat. Losing weight isn't about blame or shame; it's simply another achievement to accomplish. Dieting is like any other skill-you have to buckle down and work at it. As long as you act in a smart, reasonable way, you'll ultimately get where you want to be. Here are some weight loss pearls for you.   1. It's Not a Diet. It's a Lifestyle Thinking of a diet as something you're on and suffering through only for the short term doesn't work. To shed weight and keep it off, you need to make permanent changes to the way you eat. It's OK to indulge occasionally, of course, but if you cut calories temporarily and then revert to your old way of eating, you'll gain back the weight quicker than you can say yo-yo. Use it to lose it. Research shows that one of the best predictors of long-term weight loss is how many pounds you drop in the first month. For that reason, nutritionists often suggest being stricter for the first two weeks of your new eating strategy to build momentum. Cut out added sugar and alcohol and avoid unrefined carbs. After that, figure out how you can reincorporate them in a way that's healthy and maintainable.  2. There's a Right Way to Exercise Working out burns calories and fat and boosts your metabolism by building muscle. But those trying to lose weight are notorious for overestimating the number of calories they burn and underestimating the amount they take in. Unfortunately, your system is biologically programmed to hold on to extra pounds and that means when you start exercising, your body senses the deficit and ramps up its hunger signals. If you're not diligent, you'll eat everything you burn and then some. Use it, to lose it. Cardio gets all the exercise glory, but strength and interval training are the real heroes. They help you build lean muscle, which in turn increases your metabolism and calorie-burning ability 3. Don't Overreact to Mild Hunger Some people have a hard time losing  weight because of hunger anxiety. To them, being hungry is  bad-something to be avoided at all costs-so they carry snacks with them and eat when they don't need to. Others eat because they're stressed out or bored. While you never want to get to the point of being ravenous (that's when bingeing is likely to happen), a hunger pang, a craving, or the fact that it's 3:00 p.m. should not send you racing for the vending machine or obsessing about the energy bar in your purse. Ideally, you should put off eating until your stomach is growling and it's difficult to concentrate.  Use it to lose it. When you feel the urge to eat, use the HALT method. Ask yourself, Am I really hungry? Or am I angry or anxious, lonely or bored, or tired? If you're still not certain, try the apple test. If you're truly hungry, an apple should seem delicious; if it doesn't, something else is going on. Or you can try drinking water and making yourself busy, if you are still hungry try a healthy snack.  4. Not All Calories Are Created Equal The mechanics of weight loss are pretty simple: Take in fewer calories than you use for energy. But the kind of food you eat makes all the difference. Processed food that's high in saturated fat and refined starch or sugar can cause inflammation that disrupts the hormone signals that tell your brain you're full. The result: You eat a lot more.  Use it to lose it. Clean up your diet. Swap in whole, unprocessed foods, including vegetables, lean protein, and healthy fats that will fill you up and give you the biggest nutritional bang for your calorie buck. In a few weeks, as your brain starts receiving regular hunger and fullness signals once again, you'll notice that you feel less hungry overall and naturally start cutting back on the amount you eat.  5. Protein, Produce, and Plant-Based Fats Are Your Weight-Loss Trinity Here's why eating the three Ps regularly will help you drop pounds. Protein fills you  up. You need it to build lean muscle, which keeps your metabolism humming so that you can torch more fat. People in a weight-loss program who ate double the recommended daily allowance for protein (about 110 grams for a 150-pound woman) lost 70 percent of their weight from fat, while people who ate the RDA lost only about 40 percent, one study found. Produce is packed with filling fiber. "It's very difficult to consume too many calories if you're eating a lot of vegetables. Example: Three cups of broccoli is a lot of food, yet only 93 calories. (Fruit is another story. It can be easy to overeat and can contain a lot of calories from sugar, so be sure to monitor your intake.) Plant-based fats like olive oil and those in avocados and nuts are healthy and extra satiating.  Use it to lose it. Aim to incorporate each of the three Ps into every meal and snack. People who eat protein throughout the day are able to keep weight off, according to a study in the American Journal of Clinical Nutrition. In addition to meat, poultry and seafood, good sources are beans, lentils, eggs, tofu, and yogurt. As for fat, keep portion sizes in check by measuring out salad dressing, oil, and nut butters (shoot for one to two tablespoons). Finally, eat veggies or a little fruit at every meal. People who did that consumed 308 fewer calories but didn't feel any hungrier than when they didn't eat more produce.  7. How You Eat Is As Important As  What You Eat In order for your brain to register that you're full, you need to focus on what you're eating. Sit down whenever you eat, preferably at a table. Turn off the TV or computer, put down your phone, and look at your food. Smell it. Chew slowly, and don't put another bite on your fork until you swallow. When women ate lunch this attentively, they consumed 30 percent less when snacking later than those who listened to an audiobook at lunchtime, according to a study in the [place] Journal of  Nutrition. 8. Weighing Yourself Really Works The scale provides the best evidence about whether your efforts are paying off. Seeing the numbers tick up or down or stagnate is motivation to keep going-or to rethink your approach. A 2015 study at [facility] found that daily weigh-ins helped people lose more weight, keep it off, and maintain that loss, even after two years. Use it to lose it. Step on the scale at the same time every day for the best results. If your weight shoots up several pounds from one weigh-in to the next, don't freak out. Eating a lot of salt the night before or having your period is the likely culprit. The number should return to normal in a day or two. It's a steady climb that you need to do something about. 9. Too Much Stress and Too Little Sleep Are Your Enemies When you're tired and frazzled, your body cranks up the production of cortisol, the stress hormone that can cause carb cravings. Not getting enough sleep also boosts your levels of ghrelin, a hormone associated with hunger, while suppressing leptin, a hormone that signals fullness and satiety. People on a diet who slept only five and a half hours a night for two weeks lost 55 percent less fat and were hungrier than those who slept eight and a half hours, according to a study in the [place] Medical Association Journal. Use it to lose it. Prioritize sleep, aiming for seven hours or more a night, which research shows helps lower stress. And make sure you're getting quality zzz's. If a snoring spouse or a fidgety cat wakes you up frequently throughout the night, you may end up getting the equivalent of just four hours of sleep, according to a study from [facility]. Keep pets out of the bedroom, and use a white-noise app to drown out snoring. 10. You Will Hit a plateau-And You Can Bust Through It As you slim down, your body releases much less leptin, the fullness hormone.  If you're not strength training, start  right now. Building muscle can raise your metabolism to help you overcome a plateau. To keep your body challenged and burning calories, incorporate new moves and more intense intervals into your workouts or add another sweat session to your weekly routine. Alternatively, cut an extra 100 calories or so a day from your diet. Now that you've lost weight, your body simply doesn't need as much fuel.    Since food equals calories, in order to lose weight you must either eat fewer calories, exercise more to burn off calories with activity, or both. Food that is not used to fuel the body is stored as fat. A major component of losing weight is to make smarter food choices. Here's how:  1)   Limit non-nutritious foods, such as: Sugar, honey, syrups and candy Pastries, donuts, pies, cakes and cookies Soft drinks, sweetened juices and alcoholic beverages  2)  Cut down on high-fat foods by: -  Choosing poultry, fish or lean red meat - Choosing low-fat cooking methods, such as baking, broiling, steaming, grilling and boiling - Using low-fat or non-fat dairy products - Using vinaigrette, herbs, lemon or fat-free salad dressings - Avoiding fatty meats, such as bacon, sausage, franks, ribs and luncheon meats - Avoiding high-fat snacks like nuts, chips and chocolate - Avoiding fried foods - Using less butter, margarine, oil and mayonnaise - Avoiding high-fat gravies, cream sauces and cream-based soups  3) Eat a variety of foods, including: - Fruit and vegetables that are raw, steamed or baked - Whole grains, breads, cereal, rice and pasta - Dairy products, such as low-fat or non-fat milk or yogurt, low-fat cottage cheese and low-fat cheese - Protein-rich foods like chicken, [place], fish, lean meat and legumes, or beans  4) Change your eating habits by: - Eat three balanced meals a day to help control your hunger - Watch portion sizes and eat small servings of a variety of foods - Choose low-calorie  snacks - Eat only when you are hungry and stop when you are satisfied - Eat slowly and try not to perform other tasks while eating - Find other activities to distract you from food, such as walking, taking up a hobby or being involved in the community - Include regular exercise in your daily routine ( minimum of 20 min of moderate-intensity exercise at least 5 days/week)  - Find a support group, if necessary, for emotional support in your weight loss journey           Easy ways to cut 100 calories   1. Eat your eggs with hot sauce OR salsa instead of cheese.  Eggs are great for breakfast, but many people consider eggs and cheese to be BFFs. Instead of cheese-1 oz. of cheddar has 114 calories-top your eggs with hot sauce, which contains no calories and helps with satiety and metabolism. Salsa is also a great option!!  2. Top your toast, waffles or pancakes with fresh berries instead of jelly or syrup. Half a cup of berries-fresh, frozen or thawed-has about 40 calories, compared with 2 tbsp. of maple syrup or jelly, which both have about 100 calories. The berries will also give you a good punch of fiber, which helps keep you full and satisfied and won't spike blood sugar quickly like the jelly or syrup. 3. Swap the non-fat latte for black coffee with a splash of half-and-half. Contrary to its name, that non-fat latte has 130 calories and a startling 19g of carbohydrates per 16 oz. serving. Replacing that 'light' drinkable dessert with a black coffee with a splash of half-and-half saves you more than 100 calories per 16 oz. serving. 4. Sprinkle salads with freeze-dried raspberries instead of dried cranberries. If you want a sweet addition to your nutritious salad, stay away from dried cranberries. They have a whopping 130 calories per  cup and 30g carbohydrates. Instead, sprinkle freeze-dried raspberries guilt-free and save more than 100 calories per  cup serving, adding 3g of belly-filling  fiber. 5. Go for mustard in place of mayo on your sandwich. Mustard can add really nice flavor to any sandwich, and there are tons of varieties, from spicy to honey. A serving of mayo is 95 calories, versus 10 calories in a serving of mustard.  Or try an avocado mayo spread: You can find the recipe few click this link: https://www.californiaavocado.com/recipes/recipe-container/[place]-avocado-mayo 6. Choose a DIY salad dressing instead of the store-bought kind. Mix Dijon or whole grain mustard with low-fat Kefir or  red wine vinegar and garlic. 7. Use hummus as a spread instead of a dip. Use hummus as a spread on a high-fiber cracker or tortilla with a sandwich and save on calories without sacrificing taste. 8. Pick just one salad "accessory." Salad isn't automatically a calorie winner. It's easy to over-accessorize with toppings. Instead of topping your salad with nuts, avocado and cranberries (all three will clock in at 313 calories), just pick one. The next day, choose a different accessory, which will also keep your salad interesting. You don't wear all your jewelry every day, right? 9. Ditch the white pasta in favor of spaghetti squash. One cup of cooked spaghetti squash has about 40 calories, compared with traditional spaghetti, which comes with more than 200. Spaghetti squash is also nutrient-dense. It's a good source of fiber and Vitamins A and C, and it can be eaten just like you would eat pasta-with a great tomato sauce and [place] meatballs or with pesto, tofu and spinach, for example. 10. Dress up your chili, soups and stews with non-fat [place] yogurt instead of sour cream. Just a 'dollop' of sour cream can set you back 115 calories and a whopping 12g of fat-seven of which are of the artery-clogging variety. Added bonus: [place] yogurt is packed with muscle-building protein, calcium and B Vitamins. 11. Mash cauliflower instead of mashed potatoes. One cup of traditional mashed potatoes-in  all their creamy goodness-has more than 200 calories, compared to mashed cauliflower, which you can typically eat for less than 100 calories per 1 cup serving. Cauliflower is a great source of the antioxidant indole-3-carbinol (I3C), which may help reduce the risk of some cancers, like breast cancer. 12. Ditch the ice cream sundae in favor of a [place] yogurt parfait. Instead of a cup of ice cream or fro-yo for dessert, try 1 cup of nonfat Greek yogurt topped with fresh berries and a sprinkle of cacao nibs. Both toppings are packed with antioxidants, which can help reduce cellular inflammation and oxidative damage. And the comparison is a no-brainer: One cup of ice cream has about 275 calories; one cup of frozen yogurt has about 230; and a cup of Greek yogurt has just 130, plus twice the protein, so you're less likely to return to the freezer for a second helping. 13. Put olive oil in a spray container instead of using it directly from the bottle. Each tablespoon of olive oil is 120 calories and 15g of fat. Use a mister instead of pouring it straight into the pan or onto a salad. This allows for portion control and will save you more than 100 calories. 14. When baking, substitute canned pumpkin for butter or oil. Canned pumpkin-not pumpkin pie mix-is loaded with Vitamin A, which is important for skin and eye health, as well as immunity. And the comparisons are pretty crazy:  cup of canned pumpkin has about 40 calories, compared to butter or oil, which has more than 800 calories. Yes, 800 calories. Applesauce and mashed banana can also serve as good substitutions for butter or oil, usually in a 1:1 ratio. 15. Top casseroles with high-fiber cereal instead of breadcrumbs. Breadcrumbs are typically made with white bread, while breakfast cereals contain 5-9g of fiber per serving. Not only will you save more than 150 calories per  cup serving, the swap will also keep you more full and you'll get a metabolism boost  from the added fiber. 16. Snack on pistachios instead of macadamia nuts. Believe it or not, you get the same amount  of calories from 35 pistachios (100 calories) as you would from only five macadamia nuts. 17. Chow down on kale chips rather than potato chips. This is my favorite 'don't knock it 'till you try it' swap. Kale chips are so easy to make at home, and you can spice them up with a little grated parmesan or chili powder. Plus, they're a mere fraction of the calories of potato chips, but with the same crunch factor we crave so often. 18. Add seltzer and some fruit slices to your cocktail instead of soda or fruit juice. One cup of soda or fruit juice can pack on as much as 140 calories. Instead, use seltzer and fruit slices. The fruit provides valuable phytochemicals, such as flavonoids and anthocyanins, which help to combat cancer and stave off the aging process.

## 2017-01-19 NOTE — Progress Notes (Signed)
Impression and Recommendations:    No diagnosis found.   No problem-specific Assessment & Plan notes found for this encounter.   The patient was counseled, risk factors were discussed, anticipatory guidance given.   New Prescriptions   No medications on file     Discontinued Medications   CICLOPIROX (LOPROX) 0.77 % CREAM    Apply topically 2 (two) times daily.   FLUCONAZOLE (DIFLUCAN) 150 MG TABLET    One tablet by mouth now then repeat in 1 week.      No orders of the defined types were placed in this encounter.    Gross side effects, risk and benefits, and alternatives of medications and treatment plan in general discussed with patient.  Patient is aware that all medications have potential side effects and we are unable to predict every side effect or drug-drug interaction that may occur.   Patient will call with any questions prior to using medication if they have concerns.  Expresses verbal understanding and consents to current therapy and treatment regimen.  No barriers to understanding were identified.  Red flag symptoms and signs discussed in detail.  Patient expressed understanding regarding what to do in case of emergency\urgent symptoms  Please see AVS handed out to patient at the end of our visit for further patient instructions/ counseling done pertaining to today's office visit.   No Follow-up on file.     Note: This document was prepared using Dragon voice recognition software and may include unintentional dictation errors.  Thomasene Loteborah Lasundra Hascall 1:26 PM --------------------------------------------------------------------------------------------------------------------------------------------------------------------------------------------------------------------------------------------    Subjective:    CC:  Chief Complaint  Patient presents with  . Depression    feeling down and tired x 2-3 months    HPI: Carolyn Morris is a 26 y.o.  female who [facility] for issues as discussed below.   Pt wondering if she is depressed or not.   No problems updated.   GAD 7 : Generalized Anxiety Score 01/19/2017  Nervous, Anxious, on Edge 1  Control/stop worrying 1  Worry too much - different things 2  Trouble relaxing 0  Restless 1  Easily annoyed or irritable 3  Afraid - awful might happen 0  Total GAD 7 Score 8  Anxiety Difficulty Very difficult    [facility] 01/19/2017 12/08/2014  Decreased Interest 2 0  Down, Depressed, Hopeless 2 0  PHQ - 2 Score 4 0  Altered sleeping 3 -  Tired, decreased energy 3 -  Change in appetite 2 -  Feeling bad or failure about yourself  2 -  Trouble concentrating 3 -  Moving slowly or fidgety/restless 1 -  Suicidal thoughts 0 -  PHQ-9 Score 18 -  Difficult doing work/chores Extremely dIfficult -     Wt Readings from Last 3 Encounters:  01/19/17 224 lb 12.8 oz (102 kg)  05/12/16 215 lb 9.6 oz (97.8 kg)  04/13/16 225 lb (102.1 kg)   BP Readings from Last 3 Encounters:  01/19/17 115/77  05/12/16 102/70  04/13/16 111/77   Pulse Readings from Last 3 Encounters:  01/19/17 88  05/12/16 89  04/13/16 84   BMI Readings from Last 3 Encounters:  01/19/17 41.12 kg/m  05/12/16 39.43 kg/m  04/13/16 43.94 kg/m     Patient Care Team    Relationship Specialty Notifications Start End  Thomasene Lotpalski, Lilli Dewald, DO PCP - General Family Medicine  04/13/16      Patient Active Problem List  Diagnosis Date Noted  . Obesity, Class II, BMI 35-39.9, with comorbidity 05/12/2016  . Counseling on health promotion and disease prevention 05/12/2016  . Environmental and seasonal allergies 04/13/2016  . Ringworm of body 04/13/2016  . Vitamin D deficiency 04/11/2016  . Childhood asthma 09/10/2014    Past Medical history, Surgical history, Family history, Social history, Allergies and Medications have been entered into the medical record,  reviewed and changed as needed.    Current Meds  Medication Sig  . Vitamin D, Ergocalciferol, (DRISDOL) 50000 units CAPS capsule Take 1 capsule (50,000 Units total) by mouth every 7 (seven) days.  . [DISCONTINUED] ciclopirox (LOPROX) 0.77 % cream Apply topically 2 (two) times daily.  . [DISCONTINUED] fluconazole (DIFLUCAN) 150 MG tablet One tablet by mouth now then repeat in 1 week.    Allergies:  No Known Allergies   Review of Systems: General:   Denies fever, chills, unexplained weight loss.  Optho/Auditory:   Denies visual changes, blurred vision/LOV Respiratory:   Denies wheeze, DOE more than baseline levels.  Cardiovascular:   Denies chest pain, palpitations, new onset peripheral edema  Gastrointestinal:   Denies nausea, vomiting, diarrhea, abd pain.  Genitourinary: Denies dysuria, freq/ urgency, flank pain or discharge from genitals.  Endocrine:     Denies hot or cold intolerance, polyuria, polydipsia. Musculoskeletal:   Denies unexplained myalgias, joint swelling, unexplained arthralgias, gait problems.  Skin:  Denies new onset rash, suspicious lesions Neurological:     Denies dizziness, unexplained weakness, numbness  Psychiatric/Behavioral:   Denies mood changes, suicidal or homicidal ideations, hallucinations    Objective:   Blood pressure 115/77, pulse 88, height 5\' 2"  (1.575 m), weight 224 lb 12.8 oz (102 kg), last menstrual period 01/15/2017. Body mass index is 41.12 kg/m. General:  Well Developed, well nourished, appropriate for stated age.  Neuro:  Alert and oriented,  extra-ocular muscles intact  HEENT:  Normocephalic, atraumatic, neck supple, no carotid bruits appreciated  Skin:  no gross rash, warm, pink. Cardiac:  RRR, S1 S2 Respiratory:  ECTA B/L and A/P, Not using accessory muscles, speaking in full sentences- unlabored. Vascular:  Ext warm, no cyanosis apprec.; cap RF less 2 sec. Psych:  No HI/SI, judgement and insight good, Euthymic mood. Full  Affect.

## 2017-01-26 ENCOUNTER — Ambulatory Visit: Payer: BLUE CROSS/BLUE SHIELD | Admitting: Physician Assistant

## 2017-01-26 ENCOUNTER — Encounter: Payer: Self-pay | Admitting: Family Medicine

## 2017-01-26 ENCOUNTER — Ambulatory Visit (INDEPENDENT_AMBULATORY_CARE_PROVIDER_SITE_OTHER): Payer: BLUE CROSS/BLUE SHIELD | Admitting: Family Medicine

## 2017-01-26 VITALS — BP 113/77 | HR 84 | Temp 98.2°F | Ht 62.0 in | Wt 222.0 lb

## 2017-01-26 DIAGNOSIS — H6981 Other specified disorders of Eustachian tube, right ear: Secondary | ICD-10-CM | POA: Diagnosis not present

## 2017-01-26 DIAGNOSIS — R059 Cough, unspecified: Secondary | ICD-10-CM

## 2017-01-26 DIAGNOSIS — J329 Chronic sinusitis, unspecified: Secondary | ICD-10-CM

## 2017-01-26 DIAGNOSIS — R05 Cough: Secondary | ICD-10-CM

## 2017-01-26 MED ORDER — METHYLPREDNISOLONE SODIUM SUCC 125 MG IJ SOLR
125.0000 mg | Freq: Once | INTRAMUSCULAR | Status: AC
  Administered 2017-01-26: 125 mg via INTRAMUSCULAR

## 2017-01-26 MED ORDER — HYDROCOD POLST-CPM POLST ER 10-8 MG/5ML PO SUER: 5.0000 mL | Freq: Two times a day (BID) | ORAL | 0 refills | Status: DC | PRN

## 2017-01-26 NOTE — Progress Notes (Signed)
Pt here for an acute care OV today   Impression and Recommendations:    1. Rhinosinusitis   2. Cough   3. Eustachian tube dysfunction, right      No problem-specific Assessment & Plan notes found for this encounter.   The patient was counseled, risk factors were discussed, anticipatory guidance given.  New Prescriptions   CHLORPHENIRAMINE-HYDROCODONE (TUSSIONEX) 10-8 MG/5ML SUER    Take 5 mLs by mouth every 12 (twelve) hours as needed for cough (cough, will cause drowsiness.).    Discontinued Medications   No medications on file     No orders of the defined types were placed in this encounter.    Gross side effects, risk and benefits, and alternatives of medications and treatment plan in general discussed with patient.  Patient is aware that all medications have potential side effects and we are unable to predict every side effect or drug-drug interaction that may occur.   Patient will call with any questions prior to using medication if they have concerns.  Expresses verbal understanding and consents to current therapy and treatment regimen.  No barriers to understanding were identified.  Red flag symptoms and signs discussed in detail.  Patient expressed understanding regarding what to do in case of emergency\urgent symptoms  Please see AVS handed out to patient at the end of our visit for further patient instructions/ counseling done pertaining to today's office visit.   Return if symptoms worsen or fail to improve, for Also follow-up chronic care as previously discussed.     Note: This document was prepared using Dragon voice recognition software and may include unintentional dictation errors.  Sanvi Ehler 11:40  AM --------------------------------------------------------------------------------------------------------------------------------------------------------------------------------------------------------------------------------------------    Subjective:    CC:  Chief Complaint  Patient presents with  . URI    HPI: Carolyn Morris is a 26 y.o. female who presents to [facility] today for issues as discussed below.  Symptoms started 2-3 days ago with mostly dry cough and congestion,  PND, RN, [employer],  No F/C, no W or B- called out of work yesterday,  Little better today but called out of work.  Right ear is a little full and occasional feels a popping or water swirling and it.  Was taken over-the-counter Allergy medicines initially.  Now taking mucinex and dayquil/ nyquil.   Wt Readings from Last 3 Encounters:  01/26/17 222 lb (100.7 kg)  01/19/17 224 lb 12.8 oz (102 kg)  05/12/16 215 lb 9.6 oz (97.8 kg)   BP Readings from Last 3 Encounters:  01/26/17 113/77  01/19/17 115/77  05/12/16 102/70   BMI Readings from Last 3 Encounters:  01/26/17 40.60 kg/m  01/19/17 41.12 kg/m  05/12/16 39.43 kg/m     Patient Care Team    Relationship Specialty Notifications Start End  Thomasene Lot, DO PCP - General Family Medicine  04/13/16      Patient Active Problem List   Diagnosis Date Noted  . Low energy 01/19/2017  . Adjustment disorder with depressed mood 01/19/2017  . Sleep difficulties 01/19/2017  . Obesity, Class III, BMI 40-49.9 (morbid obesity) (HCC) 05/12/2016  . Counseling on health promotion and disease prevention 05/12/2016  . Environmental and seasonal allergies 04/13/2016  . Ringworm of body 04/13/2016  . Vitamin D deficiency 04/11/2016  . Childhood asthma 09/10/2014    Past Medical history, Surgical history, Family history, Social history, Allergies and Medications have been entered into the medical record, reviewed and changed as  needed.  Current Meds  Medication Sig  . FLUoxetine (PROZAC) 20 MG capsule Take 1 capsule (20 mg total) by mouth daily.   Current Facility-Administered Medications for the 01/26/17 encounter (Office Visit) with Thomasene Lotpalski, Shala Baumbach, DO  Medication  . methylPREDNISolone sodium succinate (SOLU-MEDROL) 125 mg/2 mL injection 125 mg    Allergies:  No Known Allergies   Review of Systems: General:   Denies fever, chills, unexplained weight loss.  Optho/Auditory:   Denies visual changes, blurred vision/LOV Respiratory:   Denies wheeze, DOE more than baseline levels.  Cardiovascular:   Denies chest pain, palpitations, new onset peripheral edema  Gastrointestinal:   Denies nausea, vomiting, diarrhea, abd pain.  Genitourinary: Denies dysuria, freq/ urgency, flank pain or discharge from genitals.  Endocrine:     Denies hot or cold intolerance, polyuria, polydipsia. Musculoskeletal:   Denies unexplained myalgias, joint swelling, unexplained arthralgias, gait problems.  Skin:  Denies new onset rash, suspicious lesions Neurological:     Denies dizziness, unexplained weakness, numbness  Psychiatric/Behavioral:   Denies mood changes, suicidal or homicidal ideations, hallucinations    Objective:   Blood pressure 113/77, pulse 84, temperature 98.2 F (36.8 C), temperature source Oral, height 5\' 2"  (1.575 m), weight 222 lb (100.7 kg), last menstrual period 01/15/2017. Body mass index is 40.6 kg/m. General:  Well Developed, well nourished, appropriate for stated age.  Neuro:  Alert and oriented,  extra-ocular muscles intact  HEENT:  Normocephalic, atraumatic, neck supple Skin:  no gross rash, warm, pink. Cardiac:  RRR, S1 S2 Respiratory:  ECTA B/L and A/P, Not using accessory muscles, speaking in full sentences- unlabored. Vascular:  Ext warm, no cyanosis apprec.; cap RF less 2 sec. Psych:  No HI/SI, judgement and insight good, Euthymic mood. Full Affect.

## 2017-01-26 NOTE — Patient Instructions (Signed)
Eustachian Tube Dysfunction The eustachian tube connects the middle ear to the back of the nose. It regulates air pressure in the middle ear by allowing air to move between the ear and nose. It also helps to drain fluid from the middle ear space. When the eustachian tube does not function properly, air pressure, fluid, or both can build up in the middle ear. Eustachian tube dysfunction can affect one or both ears. What are the causes? This condition happens when the eustachian tube becomes blocked or cannot open normally. This may result from:  Ear infections.  Colds and other upper respiratory infections.  Allergies.  Irritation, such as from cigarette smoke or acid from the stomach coming up into the esophagus (gastroesophageal reflux).  Sudden changes in air pressure, such as from descending in an airplane.  Abnormal growths in the nose or throat, such as nasal polyps, tumors, or enlarged tissue at the back of the throat (adenoids).  What increases the risk? This condition may be more likely to develop in people who smoke and people who are overweight. Eustachian tube dysfunction may also be more likely to develop in children, especially children who have:  Certain birth defects of the mouth, such as cleft palate.  Large tonsils and adenoids.  What are the signs or symptoms? Symptoms of this condition may include:  A feeling of fullness in the ear.  Ear pain.  Clicking or popping noises in the ear.  Ringing in the ear.  Hearing loss.  Loss of balance.  Symptoms may get worse when the air pressure around you changes, such as when you travel to an area of high elevation or fly on an airplane. How is this diagnosed? This condition may be diagnosed based on:  Your symptoms.  A physical exam of your ear, nose, and throat.  Tests, such as those that measure: ? The movement of your eardrum (tympanogram). ? Your hearing (audiometry).  How is this treated? Treatment  depends on the cause and severity of your condition. If your symptoms are mild, you may be able to relieve your symptoms by moving air into ("popping") your ears. If you have symptoms of fluid in your ears, treatment may include:  Decongestants.  Antihistamines.  Nasal sprays or ear drops that contain medicines that reduce swelling (steroids).  In some cases, you may need to have a procedure to drain the fluid in your eardrum (myringotomy). In this procedure, a small tube is placed in the eardrum to:  Drain the fluid.  Restore the air in the middle ear space.  Follow these instructions at home:  Take over-the-counter and prescription medicines only as told by your health care provider.  Use techniques to help pop your ears as recommended by your health care provider. These may include: ? Chewing gum. ? Yawning. ? Frequent, forceful swallowing. ? Closing your mouth, holding your nose closed, and gently blowing as if you are trying to blow air out of your nose.  Do not do any of the following until your health care provider approves: ? Travel to high altitudes. ? Fly in airplanes. ? Work in a pressurized cabin or room. ? Scuba dive.  Keep your ears dry. Dry your ears completely after showering or bathing.  Do not smoke.  Keep all follow-up visits as told by your health care provider. This is important. Contact a health care provider if:  Your symptoms do not go away after treatment.  Your symptoms come back after treatment.  You are   unable to pop your ears.  You have: ? A fever. ? Pain in your ear. ? Pain in your head or neck. ? Fluid draining from your ear.  Your hearing suddenly changes.  You become very dizzy.  You lose your balance. This information is not intended to replace advice given to you by your health care provider. Make sure you discuss any questions you have with your health care provider. Document Released: 07/26/2015 Document Revised: 12/05/2015  Document Reviewed: 07/18/2014 Elsevier Interactive Patient Education  2018 [employer] Sinusitis, Adult Sinusitis is soreness and inflammation of your sinuses. Sinuses are hollow spaces in the bones around your face. They are located:  Around your eyes.  In the middle of your forehead.  Behind your nose.  In your cheekbones.  Your sinuses and nasal passages are lined with a stringy fluid (mucus). Mucus normally drains out of your sinuses. When your nasal tissues get inflamed or swollen, the mucus can get trapped or blocked so air cannot flow through your sinuses. This lets bacteria, viruses, and funguses grow, and that leads to infection. Follow these instructions at home: Medicines  Take, use, or apply over-the-counter and prescription medicines only as told by your doctor. These may include nasal sprays.  If you were prescribed an antibiotic medicine, take it as told by your doctor. Do not stop taking the antibiotic even if you start to feel better. Hydrate and Humidify  Drink enough water to keep your pee (urine) clear or pale yellow.  Use a cool mist humidifier to keep the humidity level in your home above 50%.  Breathe in steam for 10-15 minutes, 3-4 times a day or as told by your doctor. You can do this in the bathroom while a hot shower is running.  Try not to spend time in cool or dry air. Rest  Rest as much as possible.  Sleep with your head raised (elevated).  Make sure to get enough sleep each night. General instructions  Put a warm, moist washcloth on your face 3-4 times a day or as told by your doctor. This will help with discomfort.  Wash your hands often with soap and water. If there is no soap and water, use hand sanitizer.  Do not smoke. Avoid being around people who are smoking (secondhand smoke).  Keep all follow-up visits as told by your doctor. This is important. Contact a doctor if:  You have a fever.  Your symptoms get worse.  Your symptoms  do not get better within 10 days. Get help right away if:  You have a very bad headache.  You cannot stop throwing up (vomiting).  You have pain or swelling around your face or eyes.  You have trouble seeing.  You feel confused.  Your neck is stiff.  You have trouble breathing. This information is not intended to replace advice given to you by your health care provider. Make sure you discuss any questions you have with your health care provider. Document Released: 12/16/2007 Document Revised: 02/23/2016 Document Reviewed: 04/24/2015 Elsevier Interactive Patient Education  2018 [employer]  

## 2017-03-11 ENCOUNTER — Ambulatory Visit: Payer: BLUE CROSS/BLUE SHIELD | Admitting: Family Medicine

## 2017-07-22 ENCOUNTER — Ambulatory Visit (INDEPENDENT_AMBULATORY_CARE_PROVIDER_SITE_OTHER): Payer: BLUE CROSS/BLUE SHIELD | Admitting: Family Medicine

## 2017-07-22 ENCOUNTER — Encounter: Payer: Self-pay | Admitting: Family Medicine

## 2017-07-22 VITALS — BP 105/70 | HR 94 | Ht 62.0 in | Wt 225.0 lb

## 2017-07-22 DIAGNOSIS — L0101 Non-bullous impetigo: Secondary | ICD-10-CM

## 2017-07-22 MED ORDER — MUPIROCIN CALCIUM 2 % NA OINT: TOPICAL_OINTMENT | NASAL | 1 refills | Status: DC

## 2017-07-22 NOTE — Patient Instructions (Addendum)
This is highly contagious.  Please be out of work for the next 5 days.  May return to work on Monday.  Please keep any skin lesions completely covered when in front of others.  Impetigo, Adult Impetigo is an infection of the skin. It commonly occurs in young children, but it can also occur in adults. The infection causes itchy blisters and sores that produce brownish-yellow fluid. As the fluid dries, it forms a thick, honey-colored crust. These skin changes usually occur on the face but can also affect other areas of the body. Impetigo usually goes away in 7-10 days with treatment. What are the causes? Impetigo is caused by two types of bacteria. It may be caused by staphylococci or streptococci bacteria. These bacteria cause impetigo when they get under the surface of the skin. This often happens after some damage to the skin, such as damage from:  Cuts, scrapes, or scratches.  Insect bites, especially when you scratch the area of a bite.  Chickenpox or other illnesses that cause open skin sores.  Nail biting or chewing.  Impetigo is contagious and can spread easily from one person to another. This may occur through close skin contact or by sharing towels, clothing, or other items with a person who has the infection. What increases the risk? Some things that can increase the risk of getting this infection include:  Playing sports that include skin-to-skin contact with others.  Having a skin condition with open sores.  Having many skin cuts or scrapes.  Living in an area that has high humidity levels.  Having poor hygiene.  Having high levels of staphylococci in your nose.  What are the signs or symptoms? Impetigo usually starts out as small blisters, often on the face. The blisters then break open and turn into tiny sores (lesions) with a yellow crust. In some cases, the blisters cause itching or burning. With scratching, irritation, or lack of treatment, these small lesions may  get larger. Scratching can also cause impetigo to spread to other parts of the body. The bacteria can get under the fingernails and spread when you touch another area of your skin. Other possible symptoms include:  Larger blisters.  Pus.  Swollen lymph glands.  How is this diagnosed? This condition is usually diagnosed during a physical exam. A skin sample or sample of fluid from a blister may be taken for lab tests that involve growing bacteria (culture test). This can help confirm the diagnosis or help determine the best treatment. How is this treated? Mild impetigo can be treated with prescription antibiotic cream. Oral antibiotic medicine may be used in more severe cases. Medicines for itching may also be used. Follow these instructions at home:  Take medicines only as directed by your health care provider.  To help prevent impetigo from spreading to other body areas: ? Keep your fingernails short and clean. ? Do not scratch the blisters or sores. ? Cover infected areas, if necessary, to keep from scratching.  Gently wash the infected areas with antibiotic soap and water.  Soak crusted areas in warm, soapy water using antibiotic soap. ? Gently rub the areas to remove crusts. Do not scrub.  Wash your hands often to avoid spreading this infection.  Stay home until you have used an antibiotic cream for 48 hours (2 days) or an oral antibiotic medicine for 24 hours (1 day). You should only return to work and activities with other people if your skin shows significant improvement. How is this prevented?  To keep the infection from spreading:  Stay home until you have used an antibiotic cream for 48 hours or an oral antibiotic for 24 hours.  Wash your hands often.  Do not engage in skin-to-skin contact with other people while you have still have blisters.  Do not share towels, washcloths, or bedding with others while you have the infection.  Contact a health care provider  if:  You develop more blisters or sores despite treatment.  Other family members get sores.  Your skin sores are not improving after 48 hours of treatment.  You have a fever. Get help right away if:  You see spreading redness or swelling of the skin around your sores.  You see red streaks coming from your sores.  You develop a sore throat. This information is not intended to replace advice given to you by your health care provider. Make sure you discuss any questions you have with your health care provider. Document Released: 07/20/2014 Document Revised: 12/05/2015 Document Reviewed: 06/12/2014 Elsevier Interactive Patient Education  2017 [employer].

## 2017-07-22 NOTE — Progress Notes (Signed)
Pt here for an acute care OV today   Impression and Recommendations:    1. Non-bullous impetigo     - Per photos provided by the patient, rash appeared at first as a patch of multiple erythematous papules, which later progressed and coalesced.  Then started oozing  honey colored exudate. Appearance c/w of impetigo. Educational materials on impetigo were provided for the patient.   - will treat infection with ABX ointment and warm compresses. Will prescribe ointment that will cover for MRSA. She should not rub the area with her warm compress, and should avoid touching it as it will spread to other areas of the body.  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  Meds ordered this encounter  Medications  . mupirocin nasal ointment (BACTROBAN) 2 %    Sig: Apply to affected skin 3 times daily until lesions completely resolved    Dispense:  30 g    Refill:  1     Education and routine counseling performed. Handouts provided  Gross side effects, risk and benefits, and alternatives of medications and treatment plan in general discussed with patient.  Patient is aware that all medications have potential side effects and we are unable to predict every side effect or drug-drug interaction that may occur.   Patient will call with any questions prior to using medication if they have concerns.  Expresses verbal understanding and consents to current therapy and treatment regimen.  No barriers to understanding were identified.  Red flag symptoms and signs discussed in detail.  Patient expressed understanding regarding what to do in case of emergency\urgent symptoms   Please see AVS handed out to patient at the end of our visit for further patient instructions/ counseling done pertaining to today's office visit.   Return if symptoms worsen or fail to improve.     Note: This document was prepared  occasionally using Dragon voice recognition software and may include unintentional dictation errors in addition to a scribe.     This document serves as a record of services personally performed by Thomasene Loteborah Alondra Vandeven, DO. It was created on her behalf by Peggye FothergillKatherine Galloway, a trained medical scribe. The creation of this record is based on the scribe's personal observations and the provider's statements to them.  I have reviewed the above medical documentation for accuracy and completeness and I concur.  Thomasene LotDeborah Lindalee Huizinga 07/22/17 4:55 PM    --------------------------------------------------------------------------------------------------------------------------------------------------------------------------------------------------------------------------------------------    Subjective:    CC:  Chief Complaint  Patient presents with  . Rash    left side of the face at nose x 3 days    HPI: Carolyn Morris is a 27 y.o. female who [facility] today for issues as discussed below.  Her rash began three days ago. Since it began, she hasn't put anything on it, and hasn't tried to treat it. - Per sequential photos provided by the patient over a 3 day time period, rash appeared at first as a patch of multiple erythematous papules, which later progressed and coalesced.  Then started oozing  honey colored exudate. Pt feels that it began like a breakout almost. Little pimple-like bumps. The next day it worsened. Today is day three, and it is currently oozing. Patient provided photos of the progress of the rash, starting from day one.  There is sometimes pain when she moves  her face due to dryness in the area. The pain is not burning unless she touches it with a tissue, or otherwise tries to contact it. While sitting and doing nothing, there is no pain or burning - only oozing. Pt notes that it makes her feel as though her nose is running.  No one else in  the family has this. She has not come into contact with anything strange that she knows of.  Notes leaving [employer], and then her nose itched, so she rubbed it. Later that day, the rash arose.  She's "been sick since Christmas", "fighting sinus infection"/ congestion   Wt Readings from Last 3 Encounters:  07/22/17 225 lb (102.1 kg)  01/26/17 222 lb (100.7 kg)  01/19/17 224 lb 12.8 oz (102 kg)   BP Readings from Last 3 Encounters:  07/22/17 105/70  01/26/17 113/77  01/19/17 115/77   BMI Readings from Last 3 Encounters:  07/22/17 41.15 kg/m  01/26/17 40.60 kg/m  01/19/17 41.12 kg/m     Patient Care Team    Relationship Specialty Notifications Start End  Thomasene Lot, DO PCP - General Family Medicine  04/13/16      Patient Active Problem List   Diagnosis Date Noted  . Low energy 01/19/2017  . Adjustment disorder with depressed mood 01/19/2017  . Sleep difficulties 01/19/2017  . Obesity, Class III, BMI 40-49.9 (morbid obesity) (HCC) 05/12/2016  . Counseling on health promotion and disease prevention 05/12/2016  . Environmental and seasonal allergies 04/13/2016  . Ringworm of body 04/13/2016  . Vitamin D deficiency 04/11/2016  . Childhood asthma 09/10/2014    Past Medical history, Surgical history, Family history, Social history, Allergies and Medications have been entered into the medical record, reviewed and changed as needed.    No outpatient medications have been marked as taking for the 07/22/17 encounter (Office Visit) with Thomasene Lot, DO.    Allergies:  No Known Allergies   Review of Systems: General:   Denies fever, chills, unexplained weight loss.  Optho/Auditory:   Denies visual changes, blurred vision/LOV Respiratory:   Denies wheeze, DOE more than baseline levels.  Cardiovascular:   Denies chest pain, palpitations, new onset peripheral edema  Gastrointestinal:   Denies nausea, vomiting, diarrhea, abd pain.  Genitourinary: Denies dysuria,  freq/ urgency, flank pain or discharge from genitals.  Endocrine:     Denies hot or cold intolerance, polyuria, polydipsia. Musculoskeletal:   Denies unexplained myalgias, joint swelling, unexplained arthralgias, gait problems.  Skin:  Denies new onset rash, suspicious lesions Neurological:     Denies dizziness, unexplained weakness, numbness  Psychiatric/Behavioral:   Denies mood changes, suicidal or homicidal ideations, hallucinations   Objective:   Blood pressure 105/70, pulse 94, height 5\' 2"  (1.575 m), weight 225 lb (102.1 kg), last menstrual period 07/06/2017, SpO2 99 %. Body mass index is 41.15 kg/m. General:  Well Developed, well nourished, appropriate for stated age.  Neuro:  Alert and oriented,  extra-ocular muscles intact  HEENT:  Normocephalic, atraumatic, neck supple Skin: On face, multiple erythematous vesicles around left naris in buccal fold, oozing, that now crusted with a honey-colored exudate. Otherwise warm, pink. Cardiac:  RRR, S1 S2 Respiratory:  ECTA B/L and A/P, Not using accessory muscles, speaking in full sentences- unlabored. Vascular:  Ext warm, no cyanosis apprec.; cap RF less 2 sec. Psych:  No HI/SI, judgement and insight good, Euthymic mood. Full Affect.

## 2017-07-26 ENCOUNTER — Telehealth: Payer: Self-pay | Admitting: Family Medicine

## 2017-07-26 ENCOUNTER — Ambulatory Visit (INDEPENDENT_AMBULATORY_CARE_PROVIDER_SITE_OTHER): Payer: BLUE CROSS/BLUE SHIELD | Admitting: Family Medicine

## 2017-07-26 ENCOUNTER — Encounter: Payer: Self-pay | Admitting: Family Medicine

## 2017-07-26 VITALS — BP 112/61 | HR 86 | Ht 62.0 in | Wt 225.0 lb

## 2017-07-26 DIAGNOSIS — L299 Pruritus, unspecified: Secondary | ICD-10-CM | POA: Diagnosis not present

## 2017-07-26 DIAGNOSIS — L0101 Non-bullous impetigo: Secondary | ICD-10-CM | POA: Diagnosis not present

## 2017-07-26 MED ORDER — HYDROXYZINE HCL 50 MG PO TABS: 50.0000 mg | ORAL_TABLET | Freq: Four times a day (QID) | ORAL | 1 refills | Status: DC | PRN

## 2017-07-26 MED ORDER — CEPHALEXIN 500 MG PO CAPS: 500.0000 mg | ORAL_CAPSULE | Freq: Two times a day (BID) | ORAL | 0 refills | Status: DC

## 2017-07-26 NOTE — Telephone Encounter (Signed)
Spoke with Dr. Sharee Holsterpalski patient advised to come in for a follow up visit - appointment made by Dondra SpryGail for today at 215. MPulliam, CMA/RT(R)

## 2017-07-26 NOTE — Patient Instructions (Signed)
Please see the handouts I printed for you from up-to-date on beyond the basics on impetigo.  If you have any questions or concerns please do not hesitate to get in touch with us.

## 2017-07-26 NOTE — Progress Notes (Signed)
Impression and Recommendations:    1. Non-bullous impetigo- now worse   2. Itching     1. Non-bullous impetigo- now worse. Will prescribe 10 day course of oral Abx Keflex as symptoms are worsening and spreading across her face and previous Rx bactroban is not containing it. Pt will be off work after 24 hours being on new Abx prescription, on 07-28-17. Pt can continue using Bactroban as needed. Handouts and information provided. Pt instructed to call the office if symptoms worsen or if she has questions.  2. Itching: Will prescribe Atarax for itching. Pt instructed of the potential side effects of Atarax including drowsiness. Take Atarax every 6 hours as needed if you do not feel drowsiness or other side effects. Handouts and information provided.  Gross side effects, risk and benefits, and alternatives of medications and treatment plan in general discussed with patient.  Patient is aware that all medications have potential side effects and we are unable to predict every side effect or drug-drug interaction that may occur.   Patient will call with any questions prior to using medication if they have concerns.  Expresses verbal understanding and consents to current therapy and treatment regimen.  No barriers to understanding were identified.  Red flag symptoms and signs discussed in detail.  Patient expressed understanding regarding what to do in case of emergency\urgent symptoms  Please see AVS handed out to patient at the end of our visit for further patient instructions/ counseling done pertaining to today's office visit.   Return if symptoms worsen or fail to improve, for May return to work after 24 hours on antibiotics, thus Wednesday 1/16.    Note: This note was prepared with assistance of Dragon voice recognition software. Occasional wrong-word or sound-a-like substitutions may have occurred due to the inherent limitations of voice recognition software.  This document serves as a  record of services personally performed by Thomasene Loteborah Rosalena Mccorry, DO. It was created on her behalf by Thelma BargeNick Cochran, a trained medical scribe. The creation of this record is based on the scribe's personal observations and the provider's statements to them.   I have reviewed the above medical documentation for accuracy and completeness and I concur.  Thomasene LotDeborah Essynce Munsch 07/26/17 2:49 PM   --------------------------------------------------------------------------------------------------------------------------------------------------------------------------------------------------------------------------------------------    Subjective:     HPI: Carolyn Morris is a 27 y.o. female who [facility] today for issues as discussed below. Pt has NKDA and does not get typical yeast infections from Abx use. 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   Rash: Pt is following up from being seen 07-22-16 s/p impetigo diagnosis on her face. She was prescribed bactroban since it was well localized and she has used this 3x daily, but she states her symptoms are worsening and spreading more to the right side of her face and left ear. She states the rashes are also very pruritic. She denies rashes on any other parts of her body other than her face/neck and ears b/l.   Wt Readings from Last 3 Encounters:  07/26/17 225 lb (102.1 kg)  07/22/17 225 lb (102.1 kg)  01/26/17 222 lb (100.7 kg)   BP Readings from Last 3 Encounters:  07/26/17 112/61  07/22/17 105/70  01/26/17 113/77   Pulse Readings from Last 3 Encounters:  07/26/17 86  07/22/17 94  01/26/17 84   BMI Readings from Last 3 Encounters:  07/26/17 41.15 kg/m  07/22/17 41.15 kg/m  01/26/17 40.60 kg/m  Patient Care Team    Relationship Specialty Notifications Start End  Thomasene Lot, DO PCP - General Family Medicine  04/13/16      Patient Active Problem List   Diagnosis Date Noted  . Low  energy 01/19/2017  . Adjustment disorder with depressed mood 01/19/2017  . Sleep difficulties 01/19/2017  . Obesity, Class III, BMI 40-49.9 (morbid obesity) (HCC) 05/12/2016  . Counseling on health promotion and disease prevention 05/12/2016  . Environmental and seasonal allergies 04/13/2016  . Ringworm of body 04/13/2016  . Vitamin D deficiency 04/11/2016  . Childhood asthma 09/10/2014    Past Medical history, Surgical history, Family history, Social history, Allergies and Medications have been entered into the medical record, reviewed and changed as needed.    Current Meds  Medication Sig  . mupirocin ointment (BACTROBAN) 2 % Apply to affected skin 3 times daily until lesions completely resolved  . [DISCONTINUED] mupirocin nasal ointment (BACTROBAN) 2 % Apply to affected skin 3 times daily until lesions completely resolved    Allergies:  No Known Allergies   Review of Systems:  A fourteen system review of systems was performed and found to be positive as per HPI.   Objective:   Blood pressure 112/61, pulse 86, height 5\' 2"  (1.575 m), weight 225 lb (102.1 kg), last menstrual period 07/06/2017, SpO2 99 %. Body mass index is 41.15 kg/m. General:  Well Developed, well nourished, appropriate for stated age.  Neuro:  Alert and oriented,  extra-ocular muscles intact  HEENT:  Normocephalic, atraumatic, neck supple, no carotid bruits appreciated.  Skin:  warm, pink. Scabbed-over, left nasal fold, lesion, well-healed. New skin lesions bilateral  face, ears, and neck. Erythematous papules, some like dew drops on rose petal. Cardiac:  RRR, S1 S2 Respiratory:  ECTA B/L and A/P, Not using accessory muscles, speaking in full sentences- unlabored. Vascular:  Ext warm, no cyanosis apprec.; cap RF less 2 sec. Psych:  No HI/SI, judgement and insight good, Euthymic mood. Full Affect.

## 2017-07-26 NOTE — Telephone Encounter (Signed)
Patient called states believes rash is spreading or new out break, the ointment is not clearing area & patient wants to either come to be seen again or if provider can Prescribe a different/ stronger Rx to stop spreading. --Call patient at [phone]. --glh

## 2017-07-27 ENCOUNTER — Telehealth: Payer: Self-pay | Admitting: Family Medicine

## 2017-07-27 NOTE — Telephone Encounter (Signed)
Per Dr. Synthia Innocentpalski's notes patient taken out of work from lov 07/22/2017 through 07/27/2017,  Patient may return to work on 07/28/2017.  Can you please write her a work note. If the patient needs anything else please let me know. MPulliam, CMA/RT(R)

## 2017-07-27 NOTE — Telephone Encounter (Signed)
Patient called states she was taken out of work by provider due to Rash & thought employer told her no work note needed but the [employer] requires a note. --glh

## 2017-07-28 ENCOUNTER — Encounter: Payer: Self-pay | Admitting: Family Medicine

## 2017-08-02 ENCOUNTER — Telehealth: Payer: Self-pay | Admitting: Family Medicine

## 2017-08-02 NOTE — Telephone Encounter (Signed)
Pt wishes to let PCP know symptoms continue & she wished antibiotics--forwarding message to medical assistant. Advised pt provider is out of office and it maybe tomorrow before Rx can be approved. --glh

## 2017-08-02 NOTE — Telephone Encounter (Signed)
Called left message to call the office back. MPulliam, CMA/RT(R)  

## 2017-08-03 ENCOUNTER — Telehealth: Payer: Self-pay | Admitting: Family Medicine

## 2017-08-03 ENCOUNTER — Other Ambulatory Visit: Payer: Self-pay

## 2017-08-03 MED ORDER — FLUCONAZOLE 150 MG PO TABS: 150.0000 mg | ORAL_TABLET | Freq: Once | ORAL | 0 refills | Status: AC

## 2017-08-03 NOTE — Telephone Encounter (Signed)
Patient returned call from you during lunch and left VM to call back

## 2017-08-03 NOTE — Telephone Encounter (Signed)
Patient states that face is clearing up with meds but was told that she could develop a yeast infection with it and that has occurred and wants to see what to do

## 2017-08-03 NOTE — Telephone Encounter (Signed)
Spoke to patient please see other note. MPulliam, CMA/RT(R)

## 2017-08-03 NOTE — Telephone Encounter (Signed)
Spoke to patient please see other note. MPulliam, CMA/RT(R)  

## 2017-08-03 NOTE — Telephone Encounter (Signed)
Per Dr. Sharee Holsterpalski medication for yeast infection, patient notified. MPulliam, CMA/RT(R)

## 2017-08-03 NOTE — Telephone Encounter (Signed)
Patient called and states that she has developed a yeast infection - symptoms are itching and slight vaginal burning.  Per Dr. Sharee Holsterpalski sent in Diflucan. MPulliam, CMA/RT(R)

## 2017-10-13 ENCOUNTER — Encounter: Payer: Self-pay | Admitting: Physician Assistant

## 2018-04-15 ENCOUNTER — Telehealth: Payer: Self-pay | Admitting: Family Medicine

## 2018-04-15 NOTE — Telephone Encounter (Signed)
Patient called states needs CPE & labs hasn't had one in over a year--pt also says has a rash (thinks is a ringworm ?)wants provider to exam it too.  --Forwarding message to medical assistant that lab orders for a CPE are needed.-- Pt to come for blood draw 9:45am.  --glh

## 2018-04-18 ENCOUNTER — Ambulatory Visit (INDEPENDENT_AMBULATORY_CARE_PROVIDER_SITE_OTHER): Payer: BLUE CROSS/BLUE SHIELD | Admitting: Family Medicine

## 2018-04-18 ENCOUNTER — Other Ambulatory Visit: Payer: Self-pay

## 2018-04-18 ENCOUNTER — Encounter: Payer: Self-pay | Admitting: Family Medicine

## 2018-04-18 ENCOUNTER — Other Ambulatory Visit (INDEPENDENT_AMBULATORY_CARE_PROVIDER_SITE_OTHER): Payer: BLUE CROSS/BLUE SHIELD

## 2018-04-18 VITALS — BP 106/72 | HR 90 | Ht 62.0 in | Wt 229.6 lb

## 2018-04-18 DIAGNOSIS — Z719 Counseling, unspecified: Secondary | ICD-10-CM | POA: Diagnosis not present

## 2018-04-18 DIAGNOSIS — E559 Vitamin D deficiency, unspecified: Secondary | ICD-10-CM

## 2018-04-18 DIAGNOSIS — Z0000 Encounter for general adult medical examination without abnormal findings: Secondary | ICD-10-CM | POA: Diagnosis not present

## 2018-04-18 DIAGNOSIS — Z23 Encounter for immunization: Secondary | ICD-10-CM

## 2018-04-18 DIAGNOSIS — R5383 Other fatigue: Secondary | ICD-10-CM

## 2018-04-18 DIAGNOSIS — Z202 Contact with and (suspected) exposure to infections with a predominantly sexual mode of transmission: Secondary | ICD-10-CM | POA: Diagnosis not present

## 2018-04-18 DIAGNOSIS — L818 Other specified disorders of pigmentation: Secondary | ICD-10-CM

## 2018-04-18 NOTE — Patient Instructions (Signed)
Preventive Care for Adults, Female  A healthy lifestyle and preventive care can promote health and wellness. Preventive health guidelines for women include the following key practices.   A routine yearly physical is a good way to check with your health care provider about your health and preventive screening. It is a chance to share any concerns and updates on your health and to receive a thorough exam.   Visit your dentist for a routine exam and preventive care every 6 months. Brush your teeth twice a day and floss once a day. Good oral hygiene prevents tooth decay and gum disease.   The frequency of eye exams is based on your age, health, family medical history, use of contact lenses, and other factors. Follow your health care provider's recommendations for frequency of eye exams.   Eat a healthy diet. Foods like vegetables, fruits, whole grains, low-fat dairy products, and lean protein foods contain the nutrients you need without too many calories. Decrease your intake of foods high in solid fats, added sugars, and salt. Eat the right amount of calories for you.Get information about a proper diet from your health care provider, if necessary.   Regular physical exercise is one of the most important things you can do for your health. Most adults should get at least 150 minutes of moderate-intensity exercise (any activity that increases your heart rate and causes you to sweat) each week. In addition, most adults need muscle-strengthening exercises on 2 or more days a week.   Maintain a healthy weight. The body mass index (BMI) is a screening tool to identify possible weight problems. It provides an estimate of body fat based on height and weight. Your health care provider can find your BMI, and can help you achieve or maintain a healthy weight.For adults 20 years and older:   - A BMI below 18.5 is considered underweight.   - A BMI of 18.5 to 24.9 is normal.   - A BMI of 25 to 29.9 is  considered overweight.   - A BMI of 30 and above is considered obese.   Maintain normal blood lipids and cholesterol levels by exercising and minimizing your intake of trans and saturated fats.  Eat a balanced diet with plenty of fruit and vegetables. Blood tests for lipids and cholesterol should begin at age 20 and be repeated every 5 years minimum.  If your lipid or cholesterol levels are high, you are over 40, or you are at high risk for heart disease, you may need your cholesterol levels checked more frequently.Ongoing high lipid and cholesterol levels should be treated with medicines if diet and exercise are not working.   If you smoke, find out from your health care provider how to quit. If you do not use tobacco, do not start.   Lung cancer screening is recommended for adults aged 55-80 years who are at high risk for developing lung cancer because of a history of smoking. A yearly low-dose CT scan of the lungs is recommended for people who have at least a 30-pack-year history of smoking and are a current smoker or have quit within the past 15 years. A pack year of smoking is smoking an average of 1 pack of cigarettes a day for 1 year (for example: 1 pack a day for 30 years or 2 packs a day for 15 years). Yearly screening should continue until the smoker has stopped smoking for at least 15 years. Yearly screening should be stopped for people who develop a   health problem that would prevent them from having lung cancer treatment.   If you are pregnant, do not drink alcohol. If you are breastfeeding, be very cautious about drinking alcohol. If you are not pregnant and choose to drink alcohol, do not have more than 1 drink per day. One drink is considered to be 12 ounces (355 mL) of beer, 5 ounces (148 mL) of wine, or 1.5 ounces (44 mL) of liquor.   Avoid use of street drugs. Do not share needles with anyone. Ask for help if you need support or instructions about stopping the use of  drugs.   High blood pressure causes heart disease and increases the risk of stroke. Your blood pressure should be checked at least yearly.  Ongoing high blood pressure should be treated with medicines if weight loss and exercise do not work.   If you are 69-55 years old, ask your health care provider if you should take aspirin to prevent strokes.   Diabetes screening involves taking a blood sample to check your fasting blood sugar level. This should be done once every 3 years, after age 38, if you are within normal weight and without risk factors for diabetes. Testing should be considered at a younger age or be carried out more frequently if you are overweight and have at least 1 risk factor for diabetes.   Breast cancer screening is essential preventive care for women. You should practice "breast self-awareness."  This means understanding the normal appearance and feel of your breasts and may include breast self-examination.  Any changes detected, no matter how small, should be reported to a health care provider.  Women in their 80s and 30s should have a clinical breast exam (CBE) by a health care provider as part of a regular health exam every 1 to 3 years.  After age 66, women should have a CBE every year.  Starting at age 1, women should consider having a mammogram (breast X-ray test) every year.  Women who have a family history of breast cancer should talk to their health care provider about genetic screening.  Women at a high risk of breast cancer should talk to their health care providers about having an MRI and a mammogram every year.   -Breast cancer gene (BRCA)-related cancer risk assessment is recommended for women who have family members with BRCA-related cancers. BRCA-related cancers include breast, ovarian, tubal, and peritoneal cancers. Having family members with these cancers may be associated with an increased risk for harmful changes (mutations) in the breast cancer genes BRCA1 and  BRCA2. Results of the assessment will determine the need for genetic counseling and BRCA1 and BRCA2 testing.   The Pap test is a screening test for cervical cancer. A Pap test can show cell changes on the cervix that might become cervical cancer if left untreated. A Pap test is a procedure in which cells are obtained and examined from the lower end of the uterus (cervix).   - Women should have a Pap test starting at age 57.   - Between ages 90 and 70, Pap tests should be repeated every 2 years.   - Beginning at age 63, you should have a Pap test every 3 years as long as the past 3 Pap tests have been normal.   - Some women have medical problems that increase the chance of getting cervical cancer. Talk to your health care provider about these problems. It is especially important to talk to your health care provider if a  new problem develops soon after your last Pap test. In these cases, your health care provider may recommend more frequent screening and Pap tests.   - The above recommendations are the same for women who have or have not gotten the vaccine for human papillomavirus (HPV).   - If you had a hysterectomy for a problem that was not cancer or a condition that could lead to cancer, then you no longer need Pap tests. Even if you no longer need a Pap test, a regular exam is a good idea to make sure no other problems are starting.   - If you are between ages 36 and 66 years, and you have had normal Pap tests going back 10 years, you no longer need Pap tests. Even if you no longer need a Pap test, a regular exam is a good idea to make sure no other problems are starting.   - If you have had past treatment for cervical cancer or a condition that could lead to cancer, you need Pap tests and screening for cancer for at least 20 years after your treatment.   - If Pap tests have been discontinued, risk factors (such as a new sexual partner) need to be reassessed to determine if screening should  be resumed.   - The HPV test is an additional test that may be used for cervical cancer screening. The HPV test looks for the virus that can cause the cell changes on the cervix. The cells collected during the Pap test can be tested for HPV. The HPV test could be used to screen women aged 70 years and older, and should be used in women of any age who have unclear Pap test results. After the age of 67, women should have HPV testing at the same frequency as a Pap test.   Colorectal cancer can be detected and often prevented. Most routine colorectal cancer screening begins at the age of 57 years and continues through age 26 years. However, your health care provider may recommend screening at an earlier age if you have risk factors for colon cancer. On a yearly basis, your health care provider may provide home test kits to check for hidden blood in the stool.  Use of a small camera at the end of a tube, to directly examine the colon (sigmoidoscopy or colonoscopy), can detect the earliest forms of colorectal cancer. Talk to your health care provider about this at age 23, when routine screening begins. Direct exam of the colon should be repeated every 5 -10 years through age 49 years, unless early forms of pre-cancerous polyps or small growths are found.   People who are at an increased risk for hepatitis B should be screened for this virus. You are considered at high risk for hepatitis B if:  -You were born in a country where hepatitis B occurs often. Talk with your health care provider about which countries are considered high risk.  - Your parents were born in a high-risk country and you have not received a shot to protect against hepatitis B (hepatitis B vaccine).  - You have HIV or AIDS.  - You use needles to inject street drugs.  - You live with, or have sex with, someone who has Hepatitis B.  - You get hemodialysis treatment.  - You take certain medicines for conditions like cancer, organ  transplantation, and autoimmune conditions.   Hepatitis C blood testing is recommended for all people born from [birth year] through 1965 and any individual  with known risks for hepatitis C.   Practice safe sex. Use condoms and avoid high-risk sexual practices to reduce the spread of sexually transmitted infections (STIs). STIs include gonorrhea, chlamydia, syphilis, trichomonas, herpes, HPV, and human immunodeficiency virus (HIV). Herpes, HIV, and HPV are viral illnesses that have no cure. They can result in disability, cancer, and death. Sexually active women aged 25 years and younger should be checked for chlamydia. Older women with new or multiple partners should also be tested for chlamydia. Testing for other STIs is recommended if you are sexually active and at increased risk.   Osteoporosis is a disease in which the bones lose minerals and strength with aging. This can result in serious bone fractures or breaks. The risk of osteoporosis can be identified using a bone density scan. Women ages 65 years and over and women at risk for fractures or osteoporosis should discuss screening with their health care providers. Ask your health care provider whether you should take a calcium supplement or vitamin D to There are also several preventive steps women can take to avoid osteoporosis and resulting fractures or to keep osteoporosis from worsening. -->Recommendations include:  Eat a balanced diet high in fruits, vegetables, calcium, and vitamins.  Get enough calcium. The recommended total intake of is 1,200 mg daily; for best absorption, if taking supplements, divide doses into 250-500 mg doses throughout the day. Of the two types of calcium, calcium carbonate is best absorbed when taken with food but calcium citrate can be taken on an empty stomach.  Get enough vitamin D. NAMS and the National Osteoporosis Foundation recommend at least 1,000 IU per day for women age 50 and over who are at risk of vitamin D  deficiency. Vitamin D deficiency can be caused by inadequate sun exposure (for example, those who live in northern latitudes).  Avoid alcohol and smoking. Heavy alcohol intake (more than 7 drinks per week) increases the risk of falls and hip fracture and women smokers tend to lose bone more rapidly and have lower bone mass than nonsmokers. Stopping smoking is one of the most important changes women can make to improve their health and decrease risk for disease.  Be physically active every day. Weight-bearing exercise (for example, fast walking, hiking, jogging, and weight training) may strengthen bones or slow the rate of bone loss that comes with aging. Balancing and muscle-strengthening exercises can reduce the risk of falling and fracture.  Consider therapeutic medications. Currently, several types of effective drugs are available. Healthcare providers can recommend the type most appropriate for each woman.  Eliminate environmental factors that may contribute to accidents. Falls cause nearly 90% of all osteoporotic fractures, so reducing this risk is an important bone-health strategy. Measures include ample lighting, removing obstructions to walking, using nonskid rugs on floors, and placing mats and/or grab bars in showers.  Be aware of medication side effects. Some common medicines make bones weaker. These include a type of steroid drug called glucocorticoids used for arthritis and asthma, some antiseizure drugs, certain sleeping pills, treatments for endometriosis, and some cancer drugs. An overactive thyroid gland or using too much thyroid hormone for an underactive thyroid can also be a problem. If you are taking these medicines, talk to your doctor about what you can do to help protect your bones.reduce the rate of osteoporosis.    Menopause can be associated with physical symptoms and risks. Hormone replacement therapy is available to decrease symptoms and risks. You should talk to your  health care provider   about whether hormone replacement therapy is right for you.   Use sunscreen. Apply sunscreen liberally and repeatedly throughout the day. You should seek shade when your shadow is shorter than you. Protect yourself by wearing long sleeves, pants, a wide-brimmed hat, and sunglasses year round, whenever you are outdoors.   Once a month, do a whole body skin exam, using a mirror to look at the skin on your back. Tell your health care provider of new moles, moles that have irregular borders, moles that are larger than a pencil eraser, or moles that have changed in shape or color.   -Stay current with required vaccines (immunizations).   Influenza vaccine. All adults should be immunized every year.  Tetanus, diphtheria, and acellular pertussis (Td, Tdap) vaccine. Pregnant women should receive 1 dose of Tdap vaccine during each pregnancy. The dose should be obtained regardless of the length of time since the last dose. Immunization is preferred during the 27th 36th week of gestation. An adult who has not previously received Tdap or who does not know her vaccine status should receive 1 dose of Tdap. This initial dose should be followed by tetanus and diphtheria toxoids (Td) booster doses every 10 years. Adults with an unknown or incomplete history of completing a 3-dose immunization series with Td-containing vaccines should begin or complete a primary immunization series including a Tdap dose. Adults should receive a Td booster every 10 years.  Varicella vaccine. An adult without evidence of immunity to varicella should receive 2 doses or a second dose if she has previously received 1 dose. Pregnant females who do not have evidence of immunity should receive the first dose after pregnancy. This first dose should be obtained before leaving the health care facility. The second dose should be obtained 4 8 weeks after the first dose.  Human papillomavirus (HPV) vaccine. Females aged 13 26  years who have not received the vaccine previously should obtain the 3-dose series. The vaccine is not recommended for use in pregnant females. However, pregnancy testing is not needed before receiving a dose. If a female is found to be pregnant after receiving a dose, no treatment is needed. In that case, the remaining doses should be delayed until after the pregnancy. Immunization is recommended for any person with an immunocompromised condition through the age of 26 years if she did not get any or all doses earlier. During the 3-dose series, the second dose should be obtained 4 8 weeks after the first dose. The third dose should be obtained 24 weeks after the first dose and 16 weeks after the second dose.  Zoster vaccine. One dose is recommended for adults aged 60 years or older unless certain conditions are present.  Measles, mumps, and rubella (MMR) vaccine. Adults born before 1957 generally are considered immune to measles and mumps. Adults born in 1957 or later should have 1 or more doses of MMR vaccine unless there is a contraindication to the vaccine or there is laboratory evidence of immunity to each of the three diseases. A routine second dose of MMR vaccine should be obtained at least 28 days after the first dose for students attending postsecondary schools, health care workers, or international travelers. People who received inactivated measles vaccine or an unknown type of measles vaccine during 1963 1967 should receive 2 doses of MMR vaccine. People who received inactivated mumps vaccine or an unknown type of mumps vaccine before 1979 and are at high risk for mumps infection should consider immunization with 2 doses of   MMR vaccine. For females of childbearing age, rubella immunity should be determined. If there is no evidence of immunity, females who are not pregnant should be vaccinated. If there is no evidence of immunity, females who are pregnant should delay immunization until after pregnancy.  Unvaccinated health care workers born before [birth year] who lack laboratory evidence of measles, mumps, or rubella immunity or laboratory confirmation of disease should consider measles and mumps immunization with 2 doses of MMR vaccine or rubella immunization with 1 dose of MMR vaccine.  Pneumococcal 13-valent conjugate (PCV13) vaccine. When indicated, a person who is uncertain of her immunization history and has no record of immunization should receive the PCV13 vaccine. An adult aged 54 years or older who has certain medical conditions and has not been previously immunized should receive 1 dose of PCV13 vaccine. This PCV13 should be followed with a dose of pneumococcal polysaccharide (PPSV23) vaccine. The PPSV23 vaccine dose should be obtained at least 8 weeks after the dose of PCV13 vaccine. An adult aged 58 years or older who has certain medical conditions and previously received 1 or more doses of PPSV23 vaccine should receive 1 dose of PCV13. The PCV13 vaccine dose should be obtained 1 or more years after the last PPSV23 vaccine dose.  Pneumococcal polysaccharide (PPSV23) vaccine. When PCV13 is also indicated, PCV13 should be obtained first. All adults aged 58 years and older should be immunized. An adult younger than age 65 years who has certain medical conditions should be immunized. Any person who resides in a nursing home or long-term care facility should be immunized. An adult smoker should be immunized. People with an immunocompromised condition and certain other conditions should receive both PCV13 and PPSV23 vaccines. People with human immunodeficiency virus (HIV) infection should be immunized as soon as possible after diagnosis. Immunization during chemotherapy or radiation therapy should be avoided. Routine use of PPSV23 vaccine is not recommended for American Indians, [place] Natives, or people younger than 65 years unless there are medical conditions that require PPSV23 vaccine. When indicated,  people who have unknown immunization and have no record of immunization should receive PPSV23 vaccine. One-time revaccination 5 years after the first dose of PPSV23 is recommended for people aged 70 64 years who have chronic kidney failure, nephrotic syndrome, asplenia, or immunocompromised conditions. People who received 1 2 doses of PPSV23 before age 32 years should receive another dose of PPSV23 vaccine at age 96 years or later if at least 5 years have passed since the previous dose. Doses of PPSV23 are not needed for people immunized with PPSV23 at or after age 55 years.  Meningococcal vaccine. Adults with asplenia or persistent complement component deficiencies should receive 2 doses of quadrivalent meningococcal conjugate (MenACWY-D) vaccine. The doses should be obtained at least 2 months apart. Microbiologists working with certain meningococcal bacteria, [employer] recruits, people at risk during an outbreak, and people who travel to or live in countries with a high rate of meningitis should be immunized. A first-year college student up through age 58 years who is living in a residence hall should receive a dose if she did not receive a dose on or after her 16th birthday. Adults who have certain high-risk conditions should receive one or more doses of vaccine.  Hepatitis A vaccine. Adults who wish to be protected from this disease, have certain high-risk conditions, work with hepatitis A-infected animals, work in hepatitis A research labs, or travel to or work in countries with a high rate of hepatitis A should be  immunized. Adults who were previously unvaccinated and who anticipate close contact with an international adoptee during the first 60 days after arrival in the [place] from a country with a high rate of hepatitis A should be immunized.  Hepatitis B vaccine.  Adults who wish to be protected from this disease, have certain high-risk conditions, may be exposed to blood or other infectious  body fluids, are household contacts or sex partners of hepatitis B positive people, are clients or workers in certain care facilities, or travel to or work in countries with a high rate of hepatitis B should be immunized.  Haemophilus influenzae type b (Hib) vaccine. A previously unvaccinated person with asplenia or sickle cell disease or having a scheduled splenectomy should receive 1 dose of Hib vaccine. Regardless of previous immunization, a recipient of a hematopoietic stem cell transplant should receive a 3-dose series 6 12 months after her successful transplant. Hib vaccine is not recommended for adults with HIV infection.  Preventive Services / Frequency Ages 6 to 39years  Blood pressure check.** / Every 1 to 2 years.  Lipid and cholesterol check.** / Every 5 years beginning at age 39.  Clinical breast exam.** / Every 3 years for women in their 61s and 62s.  BRCA-related cancer risk assessment.** / For women who have family members with a BRCA-related cancer (breast, ovarian, tubal, or peritoneal cancers).  Pap test.** / Every 2 years from ages 47 through 85. Every 3 years starting at age 34 through age 12 or 74 with a history of 3 consecutive normal Pap tests.  HPV screening.** / Every 3 years from ages 46 through ages 43 to 54 with a history of 3 consecutive normal Pap tests.  Hepatitis C blood test.** / For any individual with known risks for hepatitis C.  Skin self-exam. / Monthly.  Influenza vaccine. / Every year.  Tetanus, diphtheria, and acellular pertussis (Tdap, Td) vaccine.** / Consult your health care provider. Pregnant women should receive 1 dose of Tdap vaccine during each pregnancy. 1 dose of Td every 10 years.  Varicella vaccine.** / Consult your health care provider. Pregnant females who do not have evidence of immunity should receive the first dose after pregnancy.  HPV vaccine. / 3 doses over 6 months, if 64 and younger. The vaccine is not recommended for use in  pregnant females. However, pregnancy testing is not needed before receiving a dose.  Measles, mumps, rubella (MMR) vaccine.** / You need at least 1 dose of MMR if you were born in 1957 or later. You may also need a 2nd dose. For females of childbearing age, rubella immunity should be determined. If there is no evidence of immunity, females who are not pregnant should be vaccinated. If there is no evidence of immunity, females who are pregnant should delay immunization until after pregnancy.  Pneumococcal 13-valent conjugate (PCV13) vaccine.** / Consult your health care provider.  Pneumococcal polysaccharide (PPSV23) vaccine.** / 1 to 2 doses if you smoke cigarettes or if you have certain conditions.  Meningococcal vaccine.** / 1 dose if you are age 71 to 35 years and a Market researcher living in a residence hall, or have one of several medical conditions, you need to get vaccinated against meningococcal disease. You may also need additional booster doses.  Hepatitis A vaccine.** / Consult your health care provider.  Hepatitis B vaccine.** / Consult your health care provider.  Haemophilus influenzae type b (Hib) vaccine.** / Consult your health care provider.  Ages 55 to 64years  Blood pressure check.** / Every 1 to 2 years.  Lipid and cholesterol check.** / Every 5 years beginning at age 20 years.  Lung cancer screening. / Every year if you are aged 55 80 years and have a 30-pack-year history of smoking and currently smoke or have quit within the past 15 years. Yearly screening is stopped once you have quit smoking for at least 15 years or develop a health problem that would prevent you from having lung cancer treatment.  Clinical breast exam.** / Every year after age 40 years.  BRCA-related cancer risk assessment.** / For women who have family members with a BRCA-related cancer (breast, ovarian, tubal, or peritoneal cancers).  Mammogram.** / Every year beginning at age 40  years and continuing for as long as you are in good health. Consult with your health care provider.  Pap test.** / Every 3 years starting at age 30 years through age 65 or 70 years with a history of 3 consecutive normal Pap tests.  HPV screening.** / Every 3 years from ages 30 years through ages 65 to 70 years with a history of 3 consecutive normal Pap tests.  Fecal occult blood test (FOBT) of stool. / Every year beginning at age 50 years and continuing until age 75 years. You may not need to do this test if you get a colonoscopy every 10 years.  Flexible sigmoidoscopy or colonoscopy.** / Every 5 years for a flexible sigmoidoscopy or every 10 years for a colonoscopy beginning at age 50 years and continuing until age 75 years.  Hepatitis C blood test.** / For all people born from 1945 through 1965 and any individual with known risks for hepatitis C.  Skin self-exam. / Monthly.  Influenza vaccine. / Every year.  Tetanus, diphtheria, and acellular pertussis (Tdap/Td) vaccine.** / Consult your health care provider. Pregnant women should receive 1 dose of Tdap vaccine during each pregnancy. 1 dose of Td every 10 years.  Varicella vaccine.** / Consult your health care provider. Pregnant females who do not have evidence of immunity should receive the first dose after pregnancy.  Zoster vaccine.** / 1 dose for adults aged 60 years or older.  Measles, mumps, rubella (MMR) vaccine.** / You need at least 1 dose of MMR if you were born in 1957 or later. You may also need a 2nd dose. For females of childbearing age, rubella immunity should be determined. If there is no evidence of immunity, females who are not pregnant should be vaccinated. If there is no evidence of immunity, females who are pregnant should delay immunization until after pregnancy.  Pneumococcal 13-valent conjugate (PCV13) vaccine.** / Consult your health care provider.  Pneumococcal polysaccharide (PPSV23) vaccine.** / 1 to 2 doses if  you smoke cigarettes or if you have certain conditions.  Meningococcal vaccine.** / Consult your health care provider.  Hepatitis A vaccine.** / Consult your health care provider.  Hepatitis B vaccine.** / Consult your health care provider.  Haemophilus influenzae type b (Hib) vaccine.** / Consult your health care provider.  Ages 65 years and over  Blood pressure check.** / Every 1 to 2 years.  Lipid and cholesterol check.** / Every 5 years beginning at age 20 years.  Lung cancer screening. / Every year if you are aged 55 80 years and have a 30-pack-year history of smoking and currently smoke or have quit within the past 15 years. Yearly screening is stopped once you have quit smoking for at least 15 years or develop a health problem that   would prevent you from having lung cancer treatment.  Clinical breast exam.** / Every year after age 103 years.  BRCA-related cancer risk assessment.** / For women who have family members with a BRCA-related cancer (breast, ovarian, tubal, or peritoneal cancers).  Mammogram.** / Every year beginning at age 36 years and continuing for as long as you are in good health. Consult with your health care provider.  Pap test.** / Every 3 years starting at age 5 years through age 85 or 10 years with 3 consecutive normal Pap tests. Testing can be stopped between 65 and 70 years with 3 consecutive normal Pap tests and no abnormal Pap or HPV tests in the past 10 years.  HPV screening.** / Every 3 years from ages 93 years through ages 70 or 45 years with a history of 3 consecutive normal Pap tests. Testing can be stopped between 65 and 70 years with 3 consecutive normal Pap tests and no abnormal Pap or HPV tests in the past 10 years.  Fecal occult blood test (FOBT) of stool. / Every year beginning at age 8 years and continuing until age 45 years. You may not need to do this test if you get a colonoscopy every 10 years.  Flexible sigmoidoscopy or colonoscopy.** /  Every 5 years for a flexible sigmoidoscopy or every 10 years for a colonoscopy beginning at age 69 years and continuing until age 68 years.  Hepatitis C blood test.** / For all people born from [birth year] through 1967 and any individual with known risks for hepatitis C.  Osteoporosis screening.** / A one-time screening for women ages 7 years and over and women at risk for fractures or osteoporosis.  Skin self-exam. / Monthly.  Influenza vaccine. / Every year.  Tetanus, diphtheria, and acellular pertussis (Tdap/Td) vaccine.** / 1 dose of Td every 10 years.  Varicella vaccine.** / Consult your health care provider.  Zoster vaccine.** / 1 dose for adults aged 5 years or older.  Pneumococcal 13-valent conjugate (PCV13) vaccine.** / Consult your health care provider.  Pneumococcal polysaccharide (PPSV23) vaccine.** / 1 dose for all adults aged 74 years and older.  Meningococcal vaccine.** / Consult your health care provider.  Hepatitis A vaccine.** / Consult your health care provider.  Hepatitis B vaccine.** / Consult your health care provider.  Haemophilus influenzae type b (Hib) vaccine.** / Consult your health care provider. ** Family history and personal history of risk and conditions may change your health care provider's recommendations. Document Released: 08/25/2001 Document Revised: 04/19/2013  [facility] Patient Information 2014 [place], [place].   EXERCISE AND DIET:  We recommended that you start or continue a regular exercise program for good health. Regular exercise means any activity that makes your heart beat faster and makes you sweat.  We recommend exercising at least 30 minutes per day at least 3 days a week, preferably 5.  We also recommend a diet low in fat and sugar / carbohydrates.  Inactivity, poor dietary choices and obesity can cause diabetes, heart attack, stroke, and kidney damage, among others.     ALCOHOL AND SMOKING:  Women should limit their alcohol intake to no  more than 7 drinks/beers/glasses of wine (combined, not each!) per week. Moderation of alcohol intake to this level decreases your risk of breast cancer and liver damage.  ( And of course, no recreational drugs are part of a healthy lifestyle.)  Also, you should not be smoking at all or even being exposed to second hand smoke. Most people know smoking can  cause cancer, and various heart and lung diseases, but did you know it also contributes to weakening of your bones?  Aging of your skin?  Yellowing of your teeth and nails?   CALCIUM AND VITAMIN D:  Adequate intake of calcium and Vitamin D are recommended.  The recommendations for exact amounts of these supplements seem to change often, but generally speaking 600 mg of calcium (either carbonate or citrate) and 800 units of Vitamin D per day seems prudent. Certain women may benefit from higher intake of Vitamin D.  If you are among these women, your doctor will have told you during your visit.     PAP SMEARS:  Pap smears, to check for cervical cancer or precancers,  have traditionally been done yearly, although recent scientific advances have shown that most women can have pap smears less often.  However, every woman still should have a physical exam from her gynecologist or primary care physician every year. It will include a breast check, inspection of the vulva and vagina to check for abnormal growths or skin changes, a visual exam of the cervix, and then an exam to evaluate the size and shape of the uterus and ovaries.  And after 29 years of age, a rectal exam is indicated to check for rectal cancers. We will also provide age appropriate advice regarding health maintenance, like when you should have certain vaccines, screening for sexually transmitted diseases, bone density testing, colonoscopy, mammograms, etc.    MAMMOGRAMS:  All women over 71 years old should have a yearly mammogram. Many facilities now offer a "3D" mammogram, which may cost  around $50 extra out of pocket. If possible,  we recommend you accept the option to have the 3D mammogram performed.  It both reduces the number of women who will be called back for extra views which then turn out to be normal, and it is better than the routine mammogram at detecting truly abnormal areas.     COLONOSCOPY:  Colonoscopy to screen for colon cancer is recommended for all women at age 52.  We know, you hate the idea of the prep.  We agree, BUT, having colon cancer and not knowing it is worse!!  Colon cancer so often starts as a polyp that can be seen and removed at colonscopy, which can quite literally save your life!  And if your first colonoscopy is normal and you have no family history of colon cancer, most women don't have to have it again for 10 years.  Once every ten years, you can do something that may end up saving your life, right?  We will be happy to help you get it scheduled when you are ready.  Be sure to check your insurance coverage so you understand how much it will cost.  It may be covered as a preventative service at no cost, but you should check your particular policy.

## 2018-04-18 NOTE — Progress Notes (Signed)
Impression and Recommendations:    1. Encounter for wellness examination   2. Health education/counseling   3. Flu vaccine need   4. Obesity, Class III, BMI 40-49.9 (morbid obesity) (HCC)   5. Potential exposure to STD   6. History of tattoo     Sexual Health -Pt to return for next pap smear in 04/2019 -Educated pt on transmission for HSV-2, differences between HSV-1 and HSV-2, and ways to reduce odds of transmission -Discussed importance of notifying sexual partners of her history -Discussed reputable health information about STD and STI from the [facility] website -Discussed stigma and encouraged pt not to see her status as negative, but as an infection -Encouraged pt to receive STD testing due to new partner status -Discussed testing frequencies recommended for Pap smears and impact of lifestyle, sexual partners and family history on frequency -Educated pt on STD testing through blood, urine and pap smears  Dermatology -Encouraged pt always use sunscreen when outdoors -Discussed the importance of regular skin screening to detect changes -Discussed red flag skin changes for pt have checked if she notices -Notified pt that she had only normal freckles and birthmarks  Mammogram -Discussed health guidelines for mammograms  -Discussed family history impact on frequency of testing -Educated pt on ways to do self breast exams  -Discussed health guidelines for self-exams beginning at 40, and red flag symptoms to be aware of -Educated pt about recommendations agains  Colonoscopy -Discussed health guidelines for colonoscopy screenings -Discussed importance of family history on screening frequency   Anticipatory Guidance: Discussed importance of wearing a seatbelt while driving, not texting while driving; sunscreen when outside along with yearly skin surveillance; eating a well balanced and modest diet; physical activity at least 25 minutes per day or 150 min/ week of moderate to intense  activity.  Immunizations / Screenings / Labs:  All immunizations and screenings that patient agrees to, are up-to-date per recommendations or will be updated today.  Patient understands the needs for q 61mo dental and yearly vision screens which pt will schedule independently. Obtain CBC, CMP, HgA1c, Lipid panel, TSH and vit D when fasting if not already done recently.   Weight:   -Discussed importance of regular eercise and eating healthy foods -Encouraged pt to at least consider making better dietary choices if she doesn't want to exercise -Encouraged pt to avoid processed foods and opt for fresh foods and more vegetables  Discussed goal of losing even 5-10% of current body weight which would improve overall feelings of well being and improve objective health data significantly.   Improve nutrient density of diet through increasing intake of fruits and vegetables and decreasing saturated/trans fats, white flour products and refined sugar products.    Gross side effects, risk and benefits, and alternatives of medications discussed with patient.  Patient is aware that all medications have potential side effects and we are unable to predict every side effect or drug-drug interaction that may occur.  Expresses verbal understanding and consents to current therapy plan and treatment regimen.  F-up preventative CPE in 1 year. F/up sooner for chronic care management as discussed and/or prn.  Please see orders placed and AVS handed out to patient at the end of our visit for further patient instructions/ counseling done pertaining to today's office visit.   This document serves as a record of services personally performed by Thomasene Lot, MD. It was created on her behalf by Alphonse Guild, a trained medical scribe. The creation of this record is based  on the scribe's personal observations and the provider's statements to them.   I have reviewed the above medical documentation for accuracy and  completeness and I concur.  Thomasene Lot 04/20/18 1:11 PM     Subjective:    Chief Complaint  Patient presents with  . Annual Exam     HPI: Carolyn Morris is a 27 y.o. female who presents to [facility] today a yearly health maintenance exam.  Health Maintenance Summary Reviewed and updated, unless pt declines services.  Sexual Health -Pt is monogamous with a partner, just became sexually active with him last week -Pt is diagnosed with HSV-2 -Pt states she shared her status with him and he has been researching transmission information -States she was nervous about transmitting the disease to her partner and has been very vigilant in minimizing exposure -States she was making him wash his hands and self after every intimate experience -Pt requested a breast exam -Pt also states she's been having some itching and irritation underneath her breasts  Family history -Denies family history of colon cancer -Denies family history of cervical, ovarian or breast cancer  Dental -Pt states she has recently restarted going to the dentist and has a lot of dental work that needs to be done  Dermatology -Pt states she doesn't use sunscreen regularly -Had a few spots she was nervous about and requested examination  Exercise -Pt states she doesn't exercise and "isn't interested in it"  Tobacco History Reviewed:   Y  Alcohol:    No concerns, no excessive use Exercise Habits:   Not meeting AHA guidelines STD concerns:   none Drug Use:   None Birth control method:   n/a Menses regular:     n/a Lumps or breast concerns:      no Breast Cancer Family History:      No   Immunization History  Administered Date(s) Administered  . HPV Quadrivalent 09/20/2013  . Influenza,inj,Quad PF,6+ Mos 04/18/2018    Health Maintenance  Topic Date Due  . TETANUS/TDAP  07/22/2018 (Originally 10/16/2007)  . PAP SMEAR  05/13/2019  . INFLUENZA VACCINE  Completed    . HIV Screening  Completed     Wt Readings from Last 3 Encounters:  04/18/18 229 lb 9.6 oz (104.1 kg)  07/26/17 225 lb (102.1 kg)  07/22/17 225 lb (102.1 kg)   BP Readings from Last 3 Encounters:  04/18/18 106/72  07/26/17 112/61  07/22/17 105/70   Pulse Readings from Last 3 Encounters:  04/18/18 90  07/26/17 86  07/22/17 94     History reviewed. No pertinent past medical history.    History reviewed. No pertinent surgical history.    Family History  Problem Relation Age of Onset  . Hyperlipidemia Mother   . Diabetes Mother   . Diabetes Maternal Grandmother   . Cancer Paternal Aunt        skin cancer  . Diabetes Paternal Aunt       Social History   Substance and Sexual Activity  Drug Use No  ,   Social History   Substance and Sexual Activity  Alcohol Use Yes  . Alcohol/week: 0.0 standard drinks  ,   Social History   Tobacco Use  Smoking Status Never Smoker  Smokeless Tobacco Never Used  ,   Social History   Substance and Sexual Activity  Sexual Activity Never    No current outpatient medications on file prior to visit.   No current facility-administered medications  on file prior to visit.     Allergies: Patient has no known allergies.  Review of Systems: General:   Denies fever, chills, unexplained weight loss.  Optho/Auditory:   Denies visual changes, blurred vision/LOV Respiratory:   Denies SOB, DOE more than baseline levels.  Cardiovascular:   Denies chest pain, palpitations, new onset peripheral edema  Gastrointestinal:   Denies nausea, vomiting, diarrhea.  Genitourinary: Denies dysuria, freq/ urgency, flank pain or discharge from genitals.  Endocrine:     Denies hot or cold intolerance, polyuria, polydipsia. Musculoskeletal:   Denies unexplained myalgias, joint swelling, unexplained arthralgias, gait problems.  Skin:  Denies rash, suspicious lesions Neurological:     Denies dizziness, unexplained weakness, numbness   Psychiatric/Behavioral:   Denies mood changes, suicidal or homicidal ideations, hallucinations    Objective:    Blood pressure 106/72, pulse 90, height 5\' 2"  (1.575 m), weight 229 lb 9.6 oz (104.1 kg), SpO2 98 %. Body mass index is 41.99 kg/m. General Appearance:    Alert, cooperative, no distress, appears stated age  Head:    Normocephalic, without obvious abnormality, atraumatic  Eyes:    PERRL, conjunctiva/corneas clear, EOM's intact, fundi    benign, both eyes  Ears:    Normal TM's and external ear canals, both ears  Nose:   Nares normal, septum midline, mucosa normal, no drainage    or sinus tenderness  Throat:   Lips w/o lesion, mucosa moist, and tongue normal; teeth and   gums normal  Neck:   Supple, symmetrical, trachea midline, no adenopathy;    thyroid:  no enlargement/tenderness/nodules; no carotid   bruit or JVD  Back:     Symmetric, no curvature, ROM normal, no CVA tenderness  Lungs:     Clear to auscultation bilaterally, respirations unlabored, no       Wh/ R/ R  Chest Wall:    No tenderness or gross deformity; normal excursion   Heart:    Regular rate and rhythm, S1 and S2 normal, no murmur, rub   or gallop  Breast Exam:    No tenderness, masses, or nipple abnormality b/l; no d/c  Abdomen:     Soft, non-tender, bowel sounds active all four quadrants, NO   G/R/R, no masses, no organomegaly        Extremities:   Extremities normal, atraumatic, no cyanosis or gross edema  Pulses:   2+ and symmetric all extremities  Skin:   Warm, dry, Skin color, texture, turgor normal, no obvious  lesions -4cm x 2cm oval appearing skin lesion inferior to 5 o'clock position left breast, slightly erythematous ring around it  Psych: No HI/SI, judgement and insight good, Euthymic mood. Full Affect.  Neurologic:   CNII-XII intact, normal strength, sensation and reflexes    Throughout

## 2018-04-19 LAB — HEMOGLOBIN A1C
ESTIMATED AVERAGE GLUCOSE: 97 mg/dL
Hgb A1c MFr Bld: 5 % (ref 4.8–5.6)

## 2018-04-19 LAB — CBC WITH DIFFERENTIAL/PLATELET
BASOS: 0 %
Basophils Absolute: 0 10*3/uL (ref 0.0–0.2)
EOS (ABSOLUTE): 0.2 10*3/uL (ref 0.0–0.4)
Eos: 3 %
HEMOGLOBIN: 12.9 g/dL (ref 11.1–15.9)
Hematocrit: 40.2 % (ref 34.0–46.6)
IMMATURE GRANS (ABS): 0 10*3/uL (ref 0.0–0.1)
Immature Granulocytes: 0 %
LYMPHS ABS: 1.7 10*3/uL (ref 0.7–3.1)
Lymphs: 31 %
MCH: 26.9 pg (ref 26.6–33.0)
MCHC: 32.1 g/dL (ref 31.5–35.7)
MCV: 84 fL (ref 79–97)
Monocytes Absolute: 0.3 10*3/uL (ref 0.1–0.9)
Monocytes: 6 %
NEUTROS ABS: 3.4 10*3/uL (ref 1.4–7.0)
Neutrophils: 60 %
Platelets: 231 10*3/uL (ref 150–450)
RBC: 4.8 x10E6/uL (ref 3.77–5.28)
RDW: 14.2 % (ref 12.3–15.4)
WBC: 5.7 10*3/uL (ref 3.4–10.8)

## 2018-04-19 LAB — LIPID PANEL
CHOLESTEROL TOTAL: 152 mg/dL (ref 100–199)
Chol/HDL Ratio: 3.5 ratio (ref 0.0–4.4)
HDL: 44 mg/dL (ref >39)
LDL CALC: 91 mg/dL (ref 0–99)
TRIGLYCERIDES: 86 mg/dL (ref 0–149)
VLDL Cholesterol Cal: 17 mg/dL (ref 5–40)

## 2018-04-19 LAB — COMPREHENSIVE METABOLIC PANEL
A/G RATIO: 2.1 (ref 1.2–2.2)
ALBUMIN: 3.8 g/dL (ref 3.5–5.5)
ALT: 8 IU/L (ref 0–32)
AST: 10 IU/L (ref 0–40)
Alkaline Phosphatase: 89 IU/L (ref 39–117)
BILIRUBIN TOTAL: 0.2 mg/dL (ref 0.0–1.2)
BUN / CREAT RATIO: 12 (ref 9–23)
BUN: 8 mg/dL (ref 6–20)
CALCIUM: 8.7 mg/dL (ref 8.7–10.2)
CO2: 22 mmol/L (ref 20–29)
Chloride: 109 mmol/L — ABNORMAL HIGH (ref 96–106)
Creatinine, Ser: 0.67 mg/dL (ref 0.57–1.00)
GFR, EST AFRICAN AMERICAN: 137 mL/min/{1.73_m2} (ref >59)
GFR, EST NON AFRICAN AMERICAN: 119 mL/min/{1.73_m2} (ref >59)
GLOBULIN, TOTAL: 1.8 g/dL (ref 1.5–4.5)
Glucose: 96 mg/dL (ref 65–99)
POTASSIUM: 4.2 mmol/L (ref 3.5–5.2)
Sodium: 142 mmol/L (ref 134–144)
TOTAL PROTEIN: 5.6 g/dL — AB (ref 6.0–8.5)

## 2018-04-19 LAB — T4, FREE: Free T4: 1.11 ng/dL (ref 0.82–1.77)

## 2018-04-19 LAB — VITAMIN D 25 HYDROXY (VIT D DEFICIENCY, FRACTURES): Vit D, 25-Hydroxy: 17.1 ng/mL — ABNORMAL LOW (ref 30.0–100.0)

## 2018-04-19 LAB — TSH: TSH: 3.88 u[IU]/mL (ref 0.450–4.500)

## 2018-04-20 LAB — CHLAMYDIA/GONOCOCCUS/TRICHOMONAS, NAA
CHLAMYDIA BY NAA: NEGATIVE
GONOCOCCUS BY NAA: NEGATIVE
Trich vag by NAA: NEGATIVE

## 2018-04-26 ENCOUNTER — Telehealth: Payer: Self-pay | Admitting: Family Medicine

## 2018-04-26 NOTE — Telephone Encounter (Signed)
Patient came into office wanting to check on additional lab panels that were ordered. Checked the system and it seems the results are still not posted (8 days later). Was informed to send message to clinic staff to check on this. Please advise and patient would like a call back about findings just to keep in loop.

## 2018-04-26 NOTE — Telephone Encounter (Signed)
Called [facility] and was informed that the STD labs were not done because they were printed on a separate paper and they did not see these orders.  LVM for pt to call to discuss need for redraw.  Tiajuana Amass, CMA

## 2018-04-28 ENCOUNTER — Other Ambulatory Visit (INDEPENDENT_AMBULATORY_CARE_PROVIDER_SITE_OTHER): Payer: BLUE CROSS/BLUE SHIELD

## 2018-04-28 DIAGNOSIS — Z202 Contact with and (suspected) exposure to infections with a predominantly sexual mode of transmission: Secondary | ICD-10-CM

## 2018-04-28 NOTE — Telephone Encounter (Signed)
Patient came in today 04/28/2018 and labs were redrawn. MPulliam, CMA/RT(R)

## 2018-04-28 NOTE — Addendum Note (Signed)
Addended by: Leda Min D on: 04/28/2018 02:22 PM   Modules accepted: Level of Service

## 2018-05-01 LAB — RPR: RPR Ser Ql: NONREACTIVE

## 2018-05-01 LAB — HIV ANTIBODY (ROUTINE TESTING W REFLEX): HIV Screen 4th Generation wRfx: NONREACTIVE

## 2018-05-01 LAB — HSV TYPE I/II IGG, IGMW/ REFLEX: HSV 1 IgM: <1:10 {titer}

## 2018-05-01 LAB — HEPATITIS C ANTIBODY

## 2018-05-17 ENCOUNTER — Encounter: Payer: Self-pay | Admitting: Family Medicine

## 2018-05-17 ENCOUNTER — Ambulatory Visit (INDEPENDENT_AMBULATORY_CARE_PROVIDER_SITE_OTHER): Payer: BLUE CROSS/BLUE SHIELD | Admitting: Family Medicine

## 2018-05-17 VITALS — BP 103/69 | HR 88 | Temp 98.2°F | Ht 62.0 in | Wt 230.0 lb

## 2018-05-17 DIAGNOSIS — F4321 Adjustment disorder with depressed mood: Secondary | ICD-10-CM | POA: Diagnosis not present

## 2018-05-17 DIAGNOSIS — Z7189 Other specified counseling: Secondary | ICD-10-CM

## 2018-05-17 NOTE — Progress Notes (Signed)
Impression and Recommendations:    1. Obesity, Class III, BMI 40-49.9 (morbid obesity) (HCC)   2. Counseling on health promotion and disease prevention      Obesity, Class III, BMI 40-49.9 (morbid obesity) (HCC)  SPECIFIC GOALS for next OV: - You are going to use the lose it app and track everything that you eat.  It is extremely important you also read about obesity as a disease and what the negative consequences are for your health and wellbeing.  I printed you off something from the [facility] website but there certainly are other websites you can go to as well.  But this is a good 1 to start with  -Please bring in your phone with the app next office visit for our review  -Please do not worry about losing weight or eating healthy, I want you to eat your normal amount but just track everything.  - d/c pt that ultimate Goal is for BMI to be under 30.   Explained to patient what BMI refers to, and what it means medically.    Told patient to think about it as a "medical risk stratification measurement" and how increasing BMI is associated with increasing risk/ or worsening state of various diseases such as hypertension, hyperlipidemia, diabetes, premature OA, depression etc.  - American Heart Association guidelines for healthy diet, basically Mediterranean diet, and exercise guidelines of 30 minutes 5 days per week or more discussed in detail.  - Recommend she use a food tracking app for 1 month so she can see what she's eating  - Set a goal of losing 2 lbs a week    Adjustment disorder with depressed mood  - stable - declines need for meds - rec diet, exercise, counseling, meditation, wt loss etc   Counseling on health promotion and disease prevention - Discussed the importance of healthy eating and regular exercise - Recommended beginning a program, such as Weight Watchers, emphasizing the importance of being held accountable - Discussed importance of educating yourself on how  obesity affects your body  Pt was in the office today for 32.5+ minutes, with over 50% time spent in face to face counseling of patients various medical conditions, treatment plans of those medical conditions including medicine management and lifestyle modification, strategies to improve health and well being; and in coordination of care. SEE ABOVE TREATMENT PLAN FOR DETAILS   Gross side effects, risk and benefits, and alternatives of wt loss medications and treatment plan in general discussed with patient.  Patient is aware that all medications have potential side effects and we are unable to predict every side effect or drug-drug interaction that may occur.   Patient will call with any questions prior to using medication if they have concerns.    Expresses verbal understanding and consents to current therapy and treatment regimen.  No barriers to understanding were identified.  Red flag symptoms and signs discussed in detail.  Patient expressed understanding regarding what to do in case of emergency\urgent symptoms  Please see AVS handed out to patient at the end of our visit for further patient instructions/ counseling done pertaining to today's office visit.   Return for 1 month for wt counseling.     Note:  This note was prepared with assistance of Dragon voice recognition software. Occasional wrong-word or sound-a-like substitutions may have occurred due to the inherent limitations of voice recognition software.   This document serves as a record of services personally performed by Thomasene Lot,  DO. It was created on her behalf by Mickie Bail, a trained medical scribe. The creation of this record is based on the scribe's personal observations and the provider's statements to them.   I have reviewed the above medical documentation for accuracy and completeness and I concur.  Thomasene Lot,  D.O.      --------------------------------------------------------------------------------------------------------------------------------------------------------------------------------------------------------------------------------------------    Subjective:     HPI: Carolyn Morris is a 27 y.o. female who presents to [facility] at [facility] today for issues as discussed below.   Weight Management - She has never been on a diet or exercise program, or tried cutting back on calories - She has been overweight since about age 35 - Obesity runs in her family (mom, dad, and brother currently all overweight) - She notes feeling pressure to be thin, thinks she would feel more confident if she was skinny, wants to look thinner to wear "cute outfits" - Less worried about health issues, more about social preference  Wt Readings from Last 3 Encounters:  05/17/18 230 lb (104.3 kg)  04/18/18 229 lb 9.6 oz (104.1 kg)  07/26/17 225 lb (102.1 kg)     BP Readings from Last 3 Encounters:  05/17/18 103/69  04/18/18 106/72  07/26/17 112/61   Pulse Readings from Last 3 Encounters:  05/17/18 88  04/18/18 90  07/26/17 86   BMI Readings from Last 3 Encounters:  05/17/18 42.07 kg/m  04/18/18 41.99 kg/m  07/26/17 41.15 kg/m     Patient Care Team    Relationship Specialty Notifications Start End  Thomasene Lot, DO PCP - General Family Medicine  04/13/16      Patient Active Problem List   Diagnosis Date Noted  . Low energy 01/19/2017  . Adjustment disorder with depressed mood 01/19/2017  . Sleep difficulties 01/19/2017  . Obesity, Class III, BMI 40-49.9 (morbid obesity) (HCC) 05/12/2016  . Counseling on health promotion and disease prevention 05/12/2016  . Environmental and seasonal allergies 04/13/2016  . Ringworm of body 04/13/2016  . Vitamin D deficiency 04/11/2016  . 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    No outpatient medications have been marked as taking for the 05/17/18 encounter (Office Visit) with Thomasene Lot, DO.    Allergies:  No Known Allergies   Review of Systems:  A fourteen system review of systems was performed and found to be positive as per HPI.   Objective:   Blood pressure 103/69, pulse 88, temperature 98.2 F (36.8 C), height 5\' 2"  (1.575 m), weight 230 lb (104.3 kg), SpO2 100 %. Body mass index is 42.07 kg/m. General:  Well Developed, well nourished, appropriate for stated age.  Neuro:  Alert and oriented,  extra-ocular muscles intact  HEENT:  Normocephalic, atraumatic, neck supple, no carotid bruits appreciated  Skin:  no gross rash, warm, pink. Cardiac:  RRR, S1 S2 Respiratory:  ECTA B/L and A/P, Not using accessory muscles, speaking in full sentences- unlabored. Vascular:  Ext warm, no cyanosis apprec.; cap RF less 2 sec. Psych:  No HI/SI, judgement and insight good, Euthymic mood. Full Affect.

## 2018-05-17 NOTE — Patient Instructions (Addendum)
You are going to use the lose it app and track everything that you eat.  It is extremely important you also read about obesity as a disease and what the negative consequences are for your health and wellbeing.  I printed you off something from the [facility] website but there certainly are other websites you can go to as well.  But this is a good 1 to start with  -Please bring in your phone with the app next office visit for our review  -Please do not worry about losing weight or eating healthy, I want you to eat your normal amount but just track everything.    Behavior Modification Ideas for Weight Management  Weight management involves adopting a healthy lifestyle that includes a knowledge of nutrition and exercise, a positive attitude and the right kind of motivation. Internal motives such as better health, increased energy, self-esteem and personal control increase your chances of lifelong weight management success.  Remember to have realistic goals and think long-term success. Believe in yourself and you can do it. The following information will give you ideas to help you meet your goals.  Control Your Home Environment  Eat only while sitting down at the kitchen or dining room table. Do not eat while watching television, reading, cooking, talking on the phone, standing at the refrigerator or working on the computer. Keep tempting foods out of the house - don't buy them. Keep tempting foods out of sight. Have low-calorie foods ready to eat. Unless you are preparing a meal, stay out of the kitchen. Have healthy snacks at your disposal, such as small pieces of fruit, vegetables, canned fruit, pretzels, low-fat string cheese and nonfat cottage cheese.  Control Your Work Environment  Do not eat at [employer] or keep tempting snacks at your desk. If you get hungry between meals, plan healthy snacks and bring them with you to work. During your breaks, go for a walk instead of eating. If you work  around food, plan in advance the one item you will eat at mealtime. Make it inconvenient to nibble on food by chewing gum, sugarless candy or drinking water or another low-calorie beverage. Do not work through meals. Skipping meals slows down metabolism and may result in overeating at the next meal. If food is available for special occasions, either pick the healthiest item, nibble on low-fat snacks brought from home, don't have anything offered, choose one option and have a small amount, or have only a beverage.  Control Your Mealtime Environment  Serve your plate of food at the stove or kitchen counter. Do not put the serving dishes on the table. If you do put dishes on the table, remove them immediately when finished eating. Fill half of your plate with vegetables, a quarter with lean protein and a quarter with starch. Use smaller plates, bowls and glasses. A smaller portion will look large when it is in a little dish. Politely refuse second helpings. When fixing your plate, limit portions of food to one scoop/serving or less.   Daily Food Management  Replace eating with another activity that you will not associate with food. Wait 20 minutes before eating something you are craving. Drink a large glass of water or diet soda before eating. Always have a big glass or bottle of water to drink throughout the day. Avoid high-calorie add-ons such as cream with your coffee, butter, mayonnaise and salad dressings.  Shopping: Do not shop when hungry or tired. Shop from a list and avoid  buying anything that is not on your list. If you must have tempting foods, buy individual-sized packages and try to find a lower-calorie alternative. Don't taste test in the store. Read food labels. Compare products to help you make the healthiest choices.  Preparation: Chew a piece of gum while cooking meals. Use a quarter teaspoon if you taste test your food. Try to only fix what you are going to eat,  leaving yourself no chance for seconds. If you have prepared more food than you need, portion it into individual containers and freeze or refrigerate immediately. Don't snack while cooking meals.  Eating: Eat slowly. Remember it takes about 20 minutes for your stomach to send a message to your brain that it is full. Don't let fake hunger make you think you need more. The ideal way to eat is to take a bite, put your utensil down, take a sip of water, cut your next bite, take a bit, put your utensil down and so on. Do not cut your food all at one time. Cut only as needed. Take small bites and chew your food well. Stop eating for a minute or two at least once during a meal or snack. Take breaks to reflect and have conversation.  Cleanup and Leftovers: Label leftovers for a specific meal or snack. Freeze or refrigerate individual portions of leftovers. Do not clean up if you are still hungry.  Eating Out and Social Eating  Do not arrive hungry. Eat something light before the meal. Try to fill up on low-calorie foods, such as vegetables and fruit, and eat smaller portions of the high-calorie foods. Eat foods that you like, but choose small portions. If you want seconds, wait at least 20 minutes after you have eaten to see if you are actually hungry or if your eyes are bigger than your stomach. Limit alcoholic beverages. Try a soda water with a twist of lime. Do not skip other meals in the day to save room for the special event.  At Restaurants: Order  la carte rather than buffet style. Order some vegetables or a salad for an appetizer instead of eating bread. If you order a high-calorie dish, share it with someone. Try an after-dinner mint with your coffee. If you do have dessert, share it with two or more people. Don't overeat because you do not want to waste food. Ask for a doggie bag to take extra food home. Tell the server to put half of your entree in a to go bag before the meal is  served to you. Ask for salad dressing, gravy or high-fat sauces on the side. Dip the tip of your fork in the dressing before each bite. If bread is served, ask for only one piece. Try it plain without butter or oil. At [employer] where oil and vinegar is served with bread, use only a small amount of oil and a lot of vinegar for dipping.  At a Friend's House: Offer to bring a dish, appetizer or dessert that is low in calories. Serve yourself small portions or tell the host that you only want a small amount. Stand or sit away from the snack table. Stay away from the kitchen or stay busy if you are near the food. Limit your alcohol intake.  At [employer] and Cafeterias: Cover most of your plate with lettuce and/or vegetables. Use a salad plate instead of a dinner plate. After eating, clear away your dishes before having coffee or tea.  Entertaining at Home: Explore  low-fat, low-cholesterol cookbooks. Use single-serving foods like chicken breasts or hamburger patties. Prepare low-calorie appetizers and desserts.   Holidays: Keep tempting foods out of sight. Decorate the house without using food. Have low-calorie beverages and foods on hand for guests. Allow yourself one planned treat a day. Don't skip meals to save up for the holiday feast. Eat regular, planned meals.   Exercise Well  Make exercise a priority and a planned activity in the day. If possible, walk the entire or part of the distance to work. Get an exercise buddy. Go for a walk with a colleague during one of your breaks, go to the gym, run or take a walk with a friend, walk in the mall with a shopping companion. Park at the end of the parking lot and walk to the store or office entrance. Always take the stairs all of the way or at least part of the way to your floor. If you have a desk job, walk around the office frequently. Do leg lifts while sitting at your desk. Do something outside on the weekends like going  for a hike or a bike ride.   Have a Healthy Attitude  Make health your weight management priority. Be realistic. Have a goal to achieve a healthier you, not necessarily the lowest weight or ideal weight based on calculations or tables. Focus on a healthy eating style, not on dieting. Dieting usually lasts for a short amount of time and rarely produces long-term success. Think long term. You are developing new healthy behaviors to follow next month, in a year and in a decade.    This information is for educational purposes only and is not intended to replace the advice of your doctor or health care provider. We encourage you to discuss with your doctor any questions or concerns you may have.        Guidelines for Losing Weight   We want weight loss that will last so you should lose 1-2 pounds a week.  THAT IS IT! Please pick THREE things a month to change. Once it is a habit check off the item. Then pick another three items off the list to become habits.  If you are already doing a habit on the list GREAT!  Cross that item off!  Don't drink your calories. Ie, alcohol, soda, fruit juice, and sweet tea.   Drink more water. Drink a glass when you feel hungry or before each meal.   Eat breakfast - Complex carb and protein (likeDannon light and fit yogurt, oatmeal, fruit, eggs, [place] bacon).  Measure your cereal.  Eat no more than one cup a day. (ie Kashi)  Eat an apple a day.  Add a vegetable a day.  Try a new vegetable a month.  Use Pam! Stop using oil or butter to cook.  Don't finish your plate or use smaller plates.  Share your dessert.  Eat sugar free Jello for dessert or frozen grapes.  Don't eat 2-3 hours before bed.  Switch to whole wheat bread, pasta, and brown rice.  Make healthier choices when you eat out. No fries!  Pick baked chicken, NOT fried.  Don't forget to SLOW DOWN when you eat. It is not going anywhere.   Take the stairs.  Park far away in the  parking lot  Lift soup cans (or weights) for 10 minutes while watching TV.  Walk at work for 10 minutes during break.  Walk outside 1 time a week with your friend, kids, dog,  or significant other.  Start a walking group at church.  Walk the mall as much as you can tolerate.   Keep a food diary.  Weigh yourself daily.  Walk for 15 minutes 3 days per week.  Cook at home more often and eat out less. If life happens and you go back to old habits, it is okay.  Just start over. You can do it!  If you experience chest pain, get short of breath, or tired during the exercise, please stop immediately and inform your doctor.    Before you even begin to attack a weight-loss plan, it pays to remember this: You are not fat. You have fat. Losing weight isn't about blame or shame; it's simply another achievement to accomplish. Dieting is like any other skill-you have to buckle down and work at it. As long as you act in a smart, reasonable way, you'll ultimately get where you want to be. Here are some weight loss pearls for you.   1. It's Not a Diet. It's a Lifestyle Thinking of a diet as something you're on and suffering through only for the short term doesn't work. To shed weight and keep it off, you need to make permanent changes to the way you eat. It's OK to indulge occasionally, of course, but if you cut calories temporarily and then revert to your old way of eating, you'll gain back the weight quicker than you can say yo-yo. Use it to lose it. Research shows that one of the best predictors of long-term weight loss is how many pounds you drop in the first month. For that reason, nutritionists often suggest being stricter for the first two weeks of your new eating strategy to build momentum. Cut out added sugar and alcohol and avoid unrefined carbs. After that, figure out how you can reincorporate them in a way that's healthy and maintainable.  2. There's a Right Way to Exercise Working out burns  calories and fat and boosts your metabolism by building muscle. But those trying to lose weight are notorious for overestimating the number of calories they burn and underestimating the amount they take in. Unfortunately, your system is biologically programmed to hold on to extra pounds and that means when you start exercising, your body senses the deficit and ramps up its hunger signals. If you're not diligent, you'll eat everything you burn and then some. Use it, to lose it. Cardio gets all the exercise glory, but strength and interval training are the real heroes. They help you build lean muscle, which in turn increases your metabolism and calorie-burning ability 3. Don't Overreact to Mild Hunger Some people have a hard time losing weight because of hunger anxiety. To them, being hungry is bad-something to be avoided at all costs-so they carry snacks with them and eat when they don't need to. Others eat because they're stressed out or bored. While you never want to get to the point of being ravenous (that's when bingeing is likely to happen), a hunger pang, a craving, or the fact that it's 3:00 p.m. should not send you racing for the vending machine or obsessing about the energy bar in your purse. Ideally, you should put off eating until your stomach is growling and it's difficult to concentrate.  Use it to lose it. When you feel the urge to eat, use the HALT method. Ask yourself, Am I really hungry? Or am I angry or anxious, lonely or bored, or tired? If you're still not certain, try the  apple test. If you're truly hungry, an apple should seem delicious; if it doesn't, something else is going on. Or you can try drinking water and making yourself busy, if you are still hungry try a healthy snack.  4. Not All Calories Are Created Equal The mechanics of weight loss are pretty simple: Take in fewer calories than you use for energy. But the kind of food you eat makes all the difference. Processed food that's  high in saturated fat and refined starch or sugar can cause inflammation that disrupts the hormone signals that tell your brain you're full. The result: You eat a lot more.  Use it to lose it. Clean up your diet. Swap in whole, unprocessed foods, including vegetables, lean protein, and healthy fats that will fill you up and give you the biggest nutritional bang for your calorie buck. In a few weeks, as your brain starts receiving regular hunger and fullness signals once again, you'll notice that you feel less hungry overall and naturally start cutting back on the amount you eat.  5. Protein, Produce, and Plant-Based Fats Are Your Weight-Loss Trinity Here's why eating the three Ps regularly will help you drop pounds. Protein fills you up. You need it to build lean muscle, which keeps your metabolism humming so that you can torch more fat. People in a weight-loss program who ate double the recommended daily allowance for protein (about 110 grams for a 150-pound woman) lost 70 percent of their weight from fat, while people who ate the RDA lost only about 40 percent, one study found. Produce is packed with filling fiber. "It's very difficult to consume too many calories if you're eating a lot of vegetables. Example: Three cups of broccoli is a lot of food, yet only 93 calories. (Fruit is another story. It can be easy to overeat and can contain a lot of calories from sugar, so be sure to monitor your intake.) Plant-based fats like olive oil and those in avocados and nuts are healthy and extra satiating.  Use it to lose it. Aim to incorporate each of the three Ps into every meal and snack. People who eat protein throughout the day are able to keep weight off, according to a study in the American Journal of Clinical Nutrition. In addition to meat, poultry and seafood, good sources are beans, lentils, eggs, tofu, and yogurt. As for fat, keep portion sizes in check by measuring out salad dressing, oil, and nut  butters (shoot for one to two tablespoons). Finally, eat veggies or a little fruit at every meal. People who did that consumed 308 fewer calories but didn't feel any hungrier than when they didn't eat more produce.  7. How You Eat Is As Important As What You Eat In order for your brain to register that you're full, you need to focus on what you're eating. Sit down whenever you eat, preferably at a table. Turn off the TV or computer, put down your phone, and look at your food. Smell it. Chew slowly, and don't put another bite on your fork until you swallow. When women ate lunch this attentively, they consumed 30 percent less when snacking later than those who listened to an audiobook at lunchtime, according to a study in the [place] Journal of Nutrition. 8. Weighing Yourself Really Works The scale provides the best evidence about whether your efforts are paying off. Seeing the numbers tick up or down or stagnate is motivation to keep going-or to rethink your approach. A 2015 study  at [facility] found that daily weigh-ins helped people lose more weight, keep it off, and maintain that loss, even after two years. Use it to lose it. Step on the scale at the same time every day for the best results. If your weight shoots up several pounds from one weigh-in to the next, don't freak out. Eating a lot of salt the night before or having your period is the likely culprit. The number should return to normal in a day or two. It's a steady climb that you need to do something about. 9. Too Much Stress and Too Little Sleep Are Your Enemies When you're tired and frazzled, your body cranks up the production of cortisol, the stress hormone that can cause carb cravings. Not getting enough sleep also boosts your levels of ghrelin, a hormone associated with hunger, while suppressing leptin, a hormone that signals fullness and satiety. People on a diet who slept only five and a half hours a night for two weeks lost 55  percent less fat and were hungrier than those who slept eight and a half hours, according to a study in the [place] [employer] Journal. Use it to lose it. Prioritize sleep, aiming for seven hours or more a night, which research shows helps lower stress. And make sure you're getting quality zzz's. If a snoring spouse or a fidgety cat wakes you up frequently throughout the night, you may end up getting the equivalent of just four hours of sleep, according to a study from [facility]. Keep pets out of the bedroom, and use a white-noise app to drown out snoring. 10. You Will Hit a plateau-And You Can Bust Through It As you slim down, your body releases much less leptin, the fullness hormone.  If you're not strength training, start right now. Building muscle can raise your metabolism to help you overcome a plateau. To keep your body challenged and burning calories, incorporate new moves and more intense intervals into your workouts or add another sweat session to your weekly routine. Alternatively, cut an extra 100 calories or so a day from your diet. Now that you've lost weight, your body simply doesn't need as much fuel.    Since food equals calories, in order to lose weight you must either eat fewer calories, exercise more to burn off calories with activity, or both. Food that is not used to fuel the body is stored as fat. A major component of losing weight is to make smarter food choices. Here's how:  1)   Limit non-nutritious foods, such as: Sugar, honey, syrups and candy Pastries, donuts, pies, cakes and cookies Soft drinks, sweetened juices and alcoholic beverages  2)  Cut down on high-fat foods by: - Choosing poultry, fish or lean red meat - Choosing low-fat cooking methods, such as baking, broiling, steaming, grilling and boiling - Using low-fat or non-fat dairy products - Using vinaigrette, herbs, lemon or fat-free salad dressings - Avoiding fatty meats, such as bacon,  sausage, franks, ribs and luncheon meats - Avoiding high-fat snacks like nuts, chips and chocolate - Avoiding fried foods - Using less butter, margarine, oil and mayonnaise - Avoiding high-fat gravies, cream sauces and cream-based soups  3) Eat a variety of foods, including: - Fruit and vegetables that are raw, steamed or baked - Whole grains, breads, cereal, rice and pasta - Dairy products, such as low-fat or non-fat milk or yogurt, low-fat cottage cheese and low-fat cheese - Protein-rich foods like chicken, [place], fish, lean meat and  legumes, or beans  4) Change your eating habits by: - Eat three balanced meals a day to help control your hunger - Watch portion sizes and eat small servings of a variety of foods - Choose low-calorie snacks - Eat only when you are hungry and stop when you are satisfied - Eat slowly and try not to perform other tasks while eating - Find other activities to distract you from food, such as walking, taking up a hobby or being involved in the community - Include regular exercise in your daily routine ( minimum of 20 min of moderate-intensity exercise at least 5 days/week)  - Find a support group, if necessary, for emotional support in your weight loss journey           Easy ways to cut 100 calories   1. Eat your eggs with hot sauce OR salsa instead of cheese.  Eggs are great for breakfast, but many people consider eggs and cheese to be BFFs. Instead of cheese-1 oz. of cheddar has 114 calories-top your eggs with hot sauce, which contains no calories and helps with satiety and metabolism. Salsa is also a great option!!  2. Top your toast, waffles or pancakes with fresh berries instead of jelly or syrup. Half a cup of berries-fresh, frozen or thawed-has about 40 calories, compared with 2 tbsp. of maple syrup or jelly, which both have about 100 calories. The berries will also give you a good punch of fiber, which helps keep you full and satisfied and won't  spike blood sugar quickly like the jelly or syrup. 3. Swap the non-fat latte for black coffee with a splash of half-and-half. Contrary to its name, that non-fat latte has 130 calories and a startling 19g of carbohydrates per 16 oz. serving. Replacing that 'light' drinkable dessert with a black coffee with a splash of half-and-half saves you more than 100 calories per 16 oz. serving. 4. Sprinkle salads with freeze-dried raspberries instead of dried cranberries. If you want a sweet addition to your nutritious salad, stay away from dried cranberries. They have a whopping 130 calories per  cup and 30g carbohydrates. Instead, sprinkle freeze-dried raspberries guilt-free and save more than 100 calories per  cup serving, adding 3g of belly-filling fiber. 5. Go for mustard in place of mayo on your sandwich. Mustard can add really nice flavor to any sandwich, and there are tons of varieties, from spicy to honey. A serving of mayo is 95 calories, versus 10 calories in a serving of mustard.  Or try an avocado mayo spread: You can find the recipe few click this link: https://www.californiaavocado.com/recipes/recipe-container/california-avocado-mayo 6. Choose a DIY salad dressing instead of the store-bought kind. Mix Dijon or whole grain mustard with low-fat Kefir or red wine vinegar and garlic. 7. Use hummus as a spread instead of a dip. Use hummus as a spread on a high-fiber cracker or tortilla with a sandwich and save on calories without sacrificing taste. 8. Pick just one salad "accessory." Salad isn't automatically a calorie winner. It's easy to over-accessorize with toppings. Instead of topping your salad with nuts, avocado and cranberries (all three will clock in at 313 calories), just pick one. The next day, choose a different accessory, which will also keep your salad interesting. You don't wear all your jewelry every day, right? 9. Ditch the white pasta in favor of spaghetti squash. One cup of cooked  spaghetti squash has about 40 calories, compared with traditional spaghetti, which comes with more than 200. Spaghetti squash is also  nutrient-dense. It's a good source of fiber and Vitamins A and C, and it can be eaten just like you would eat pasta-with a great tomato sauce and [place] meatballs or with pesto, tofu and spinach, for example. 10. Dress up your chili, soups and stews with non-fat [place] yogurt instead of sour cream. Just a 'dollop' of sour cream can set you back 115 calories and a whopping 12g of fat-seven of which are of the artery-clogging variety. Added bonus: [place] yogurt is packed with muscle-building protein, calcium and B Vitamins. 11. Mash cauliflower instead of mashed potatoes. One cup of traditional mashed potatoes-in all their creamy goodness-has more than 200 calories, compared to mashed cauliflower, which you can typically eat for less than 100 calories per 1 cup serving. Cauliflower is a great source of the antioxidant indole-3-carbinol (I3C), which may help reduce the risk of some cancers, like breast cancer. 12. Ditch the ice cream sundae in favor of a [place] yogurt parfait. Instead of a cup of ice cream or fro-yo for dessert, try 1 cup of nonfat Greek yogurt topped with fresh berries and a sprinkle of cacao nibs. Both toppings are packed with antioxidants, which can help reduce cellular inflammation and oxidative damage. And the comparison is a no-brainer: One cup of ice cream has about 275 calories; one cup of frozen yogurt has about 230; and a cup of Greek yogurt has just 130, plus twice the protein, so you're less likely to return to the freezer for a second helping. 13. Put olive oil in a spray container instead of using it directly from the bottle. Each tablespoon of olive oil is 120 calories and 15g of fat. Use a mister instead of pouring it straight into the pan or onto a salad. This allows for portion control and will save you more than 100 calories. 14. When baking,  substitute canned pumpkin for butter or oil. Canned pumpkin-not pumpkin pie mix-is loaded with Vitamin A, which is important for skin and eye health, as well as immunity. And the comparisons are pretty crazy:  cup of canned pumpkin has about 40 calories, compared to butter or oil, which has more than 800 calories. Yes, 800 calories. Applesauce and mashed banana can also serve as good substitutions for butter or oil, usually in a 1:1 ratio. 15. Top casseroles with high-fiber cereal instead of breadcrumbs. Breadcrumbs are typically made with white bread, while breakfast cereals contain 5-9g of fiber per serving. Not only will you save more than 150 calories per  cup serving, the swap will also keep you more full and you'll get a metabolism boost from the added fiber. 16. Snack on pistachios instead of macadamia nuts. Believe it or not, you get the same amount of calories from 35 pistachios (100 calories) as you would from only five macadamia nuts. 17. Chow down on kale chips rather than potato chips. This is my favorite 'don't knock it 'till you try it' swap. Kale chips are so easy to make at home, and you can spice them up with a little grated parmesan or chili powder. Plus, they're a mere fraction of the calories of potato chips, but with the same crunch factor we crave so often. 18. Add seltzer and some fruit slices to your cocktail instead of soda or fruit juice. One cup of soda or fruit juice can pack on as much as 140 calories. Instead, use seltzer and fruit slices. The fruit provides valuable phytochemicals, such as flavonoids and anthocyanins, which help to combat cancer  and stave off the aging process.

## 2018-06-16 ENCOUNTER — Ambulatory Visit: Payer: BLUE CROSS/BLUE SHIELD | Admitting: Family Medicine

## 2018-07-26 ENCOUNTER — Ambulatory Visit (INDEPENDENT_AMBULATORY_CARE_PROVIDER_SITE_OTHER): Payer: BLUE CROSS/BLUE SHIELD | Admitting: Adult Health

## 2018-07-26 ENCOUNTER — Encounter: Payer: Self-pay | Admitting: Adult Health

## 2018-07-26 VITALS — BP 97/66 | HR 82 | Temp 98.3°F | Ht 62.0 in | Wt 225.9 lb

## 2018-07-26 DIAGNOSIS — J40 Bronchitis, not specified as acute or chronic: Secondary | ICD-10-CM

## 2018-07-26 MED ORDER — AZITHROMYCIN 250 MG PO TABS: ORAL_TABLET | ORAL | 0 refills | Status: DC

## 2018-07-26 MED ORDER — BENZONATATE 200 MG PO CAPS: 200.0000 mg | ORAL_CAPSULE | Freq: Two times a day (BID) | ORAL | 0 refills | Status: DC | PRN

## 2018-07-26 NOTE — Assessment & Plan Note (Addendum)
Please take Azithromycin as directed and Tessalon as needed. Declined Albuterol ProAir- due to cost  Increase water, rest, and vit c-2,000mg /day when not feeling well. If symptoms persist after antibiotic completed, then please all clinic.

## 2018-07-26 NOTE — Progress Notes (Signed)
Subjective:    Patient ID: Carolyn Morris, female    DOB: 10/04/1990, 28 y.o.   MRN: [id number]  HPI:  Carolyn Morris presents with productive cough (yellow mucus), yellow nasal drainage, and fatigue that started >1.5 weeks ago and has steadily been worsening.  Carolyn Morris denies fever/night sweats/chills. Carolyn Morris denies abdominal pain/N/V/D, however states "I feel like my stomach wants to have diarrhea", denies any loose stools today Carolyn Morris denies tobacco/vape use Carolyn Morris reports childhood asthma that will cause wheezing in adulthood Carolyn Morris does not use Albuterol inhaler- due to cost Carolyn Morris denies any plain water intake, instead hydrates with soda- coke  Patient Care Team    Relationship Specialty Notifications Start End  Thomasene Lotpalski, Deborah, DO PCP - General Family Medicine  04/13/16     Patient Active Problem List   Diagnosis Date Noted  . Bronchitis 07/26/2018  . Low energy 01/19/2017  . Adjustment disorder with depressed mood 01/19/2017  . Sleep difficulties 01/19/2017  . Obesity, Class III, BMI 40-49.9 (morbid obesity) (HCC) 05/12/2016  . Counseling on health promotion and disease prevention 05/12/2016  . Environmental and seasonal allergies 04/13/2016  . Ringworm of body 04/13/2016  . Vitamin D deficiency 04/11/2016  . 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 No pertinent past medical history.   History reviewed. No pertinent surgical history.   Family History  Problem Relation Age of Onset  . Hyperlipidemia Mother   . Diabetes Mother   . Diabetes Maternal Grandmother   . Cancer Paternal Aunt        skin cancer  . Diabetes Paternal Aunt      Social History   Substance and Sexual Activity  Drug Use No     Social History   Substance and Sexual Activity  Alcohol Use Yes  . Alcohol/week: 0.0 standard drinks     Social History   Tobacco Use  Smoking Status Never Smoker  Smokeless Tobacco Never Used     Outpatient Encounter Medications as of 07/26/2018   Medication Sig  . azithromycin (ZITHROMAX) 250 MG tablet 2 tabs day one. 1 tab days two-five  . benzonatate (TESSALON) 200 MG capsule Take 1 capsule (200 mg total) by mouth 2 (two) times daily as needed for cough.   No facility-administered encounter medications on file as of 07/26/2018.     Allergies: Patient has no known allergies.  Body mass index is 41.32 kg/m.  Blood pressure 97/66, pulse 82, temperature 98.3 F (36.8 C), temperature source Oral, height 5\' 2"  (1.575 m), weight 225 lb 14.4 oz (102.5 kg), last menstrual period 07/11/2018, SpO2 97 %.    Review of Systems  Constitutional: Positive for fatigue. Negative for activity change, appetite change, chills, diaphoresis, fever and unexpected weight change.  HENT: Positive for congestion, postnasal drip, rhinorrhea, sinus pressure, sinus pain and sneezing. Negative for trouble swallowing and voice change.   Eyes: Negative for visual disturbance.  Respiratory: Positive for wheezing. Negative for cough, shortness of breath and stridor.   Cardiovascular: Negative for chest pain, palpitations and leg swelling.  Gastrointestinal: Negative for abdominal distention, abdominal pain, blood in stool, constipation, diarrhea, nausea and vomiting.  Endocrine: Negative for cold intolerance, heat intolerance, polydipsia, polyphagia and polyuria.  Genitourinary: Negative for difficulty urinating and flank pain.  Neurological: Negative for dizziness and headaches.  Hematological: Does not bruise/bleed easily.  Psychiatric/Behavioral: Negative for sleep disturbance.       Objective:   Physical Exam Constitutional:      General: Carolyn Morris is not in acute  distress.    Appearance: Carolyn Morris is ill-appearing. Carolyn Morris is not toxic-appearing or diaphoretic.  HENT:     Head: Normocephalic and atraumatic.     Right Ear: No decreased hearing noted. Tympanic membrane is not erythematous or bulging.     Left Ear: No decreased hearing noted. Tympanic membrane is  not erythematous or bulging.     Nose:     Right Sinus: No maxillary sinus tenderness or frontal sinus tenderness.     Left Sinus: No maxillary sinus tenderness or frontal sinus tenderness.     Mouth/Throat:     Pharynx: Posterior oropharyngeal erythema present. No oropharyngeal exudate.     Tonsils: No tonsillar exudate. Swelling: 0 on the right. 0 on the left.  Eyes:     Extraocular Movements: Extraocular movements intact.     Conjunctiva/sclera: Conjunctivae normal.     Pupils: Pupils are equal, round, and reactive to light.  Cardiovascular:     Pulses: Normal pulses.     Heart sounds: Normal heart sounds. No murmur. No friction rub. No gallop.   Pulmonary:     Effort: Pulmonary effort is normal. No respiratory distress.     Breath sounds: Normal breath sounds. No stridor. No decreased breath sounds, wheezing, rhonchi or rales.     Comments: Constant strong cough noted during OV  Chest:     Chest wall: No tenderness.  Neurological:     Mental Status: Carolyn Morris is alert.       Assessment & Plan:   1. Bronchitis   Bronchitis Please take Azithromycin as directed and Tessalon as needed. Declined Albuterol ProAir- due to cost  Increase water, rest, and vit c-2,000mg /day when not feeling well. If symptoms persist after antibiotic completed, then please all clinic.  FOLLOW-UP:  Return if symptoms worsen or fail to improve.

## 2018-07-26 NOTE — Patient Instructions (Signed)

## 2018-09-01 ENCOUNTER — Ambulatory Visit (INDEPENDENT_AMBULATORY_CARE_PROVIDER_SITE_OTHER): Payer: BLUE CROSS/BLUE SHIELD | Admitting: Family Medicine

## 2018-09-01 ENCOUNTER — Encounter: Payer: Self-pay | Admitting: Family Medicine

## 2018-09-01 VITALS — BP 105/72 | HR 107 | Temp 99.5°F | Ht 62.0 in | Wt 225.0 lb

## 2018-09-01 DIAGNOSIS — J101 Influenza due to other identified influenza virus with other respiratory manifestations: Secondary | ICD-10-CM

## 2018-09-01 DIAGNOSIS — R509 Fever, unspecified: Secondary | ICD-10-CM | POA: Diagnosis not present

## 2018-09-01 DIAGNOSIS — R52 Pain, unspecified: Secondary | ICD-10-CM

## 2018-09-01 LAB — POCT INFLUENZA A/B
Influenza A, POC: POSITIVE — AB
Influenza B, POC: NEGATIVE

## 2018-09-01 MED ORDER — OSELTAMIVIR PHOSPHATE 75 MG PO CAPS: 75.0000 mg | ORAL_CAPSULE | Freq: Two times a day (BID) | ORAL | 0 refills | Status: DC

## 2018-09-01 NOTE — Patient Instructions (Signed)

## 2018-09-01 NOTE — Progress Notes (Signed)
Acute Care Office visit  Assessment and plan:  1. Influenza A   2. Body aches   3. Fever, unspecified fever cause      Flu, type A positive   -Flu vaccine ordered and type A is positive -Will prescribe tamiflu for the patient to start now. -Advised that she cannot be around anyone unless she is fever free for 24 hours without use of tylenol or Advil.  - Supportive care and various OTC medications discussed in addition to any prescribed. - Call or RTC if new symptoms, or if no improvement or worse over next several days.   - Will consider ABX if sx continue past 10 days and worsening if not already given.  -Will write a work note for the patient and note that the patient can return to work (Monday, 09/05/2018) when she is fever free without use of tylenol or Advil.   Meds ordered this encounter  Medications  . oseltamivir (TAMIFLU) 75 MG capsule    Sig: Take 1 capsule (75 mg total) by mouth 2 (two) times daily.    Dispense:  10 capsule    Refill:  0    Medications Discontinued During This Encounter  Medication Reason  . azithromycin (ZITHROMAX) 250 MG tablet Completed Course  . benzonatate (TESSALON) 200 MG capsule Completed Course     Orders Placed This Encounter  Procedures  . POCT Influenza A/B    Gross side effects, risk and benefits, and alternatives of medications discussed with patient.  Patient is aware that all medications have potential side effects and we are unable to predict every sideeffect or drug-drug interaction that may occur.  Expresses verbal understanding and consents to current therapy plan and treatment regiment.   Education and routine counseling performed. Handouts provided.  Anticipatory guidance and routine counseling done re: condition, txmnt options and need for follow up. All questions of patient's were answered.  Return if symptoms worsen or fail to improve, for F-up of current med issues as previously d/c pt.  Please see AVS handed  out to patient at the end of our visit for additional patient instructions/ counseling done pertaining to today's office visit.  Note:  This document was partially repared using Dragon voice recognition software and may include unintentional dictation errors.  This document serves as a record of services personally performed by Thomasene Lot, DO. It was created on her behalf by Chestine Spore, a trained medical scribe. The creation of this record is based on the scribe's personal observations and the provider's statements to them.   I have reviewed the above medical documentation for accuracy and completeness and I concur.  Thomasene Lot, DO 09/01/2018 5:07 PM       Subjective:    Chief Complaint  Patient presents with  . Cough    HPI:  Pt presents with Sx for 2 days. It started as a sore throat first.    C/o: sore throat, productive cough, nasal congestion, sinus pressure, post-nasal drip, and myalgias.  Denies: Fever, facial pain, and any other symptoms.     For symptoms patient has tried:  Mucinex with no relief of her symptoms. She denies any known sick contacts.   Overall getting:  She notes that her symptoms have been worsening.    Patient Care Team    Relationship Specialty Notifications Start End  Thomasene Lot, DO PCP - General Family Medicine  04/13/16     Past medical history, Surgical history, Family history reviewed and noted below,  Social history, Allergies, and Medications have been entered into the medical record, reviewed and changed as needed.   No Known Allergies  Review of Systems: - see above HPI for pertinent positives General:   No F/C, wt loss Pulm:   No DIB, pleuritic chest pain Card:  No CP, palpitations Abd:  No n/v/d or pain Ext:  No inc edema from baseline   Objective:   Blood pressure 105/72, pulse (!) 107, temperature 99.5 F (37.5 C), height 5\' 2"  (1.575 m), weight 225 lb (102.1 kg), SpO2 97 %. Body mass index is 41.15  kg/m. General: Well Developed, well nourished, appropriate for stated age.  Neuro: Alert and oriented x3, extra-ocular muscles intact, sensation grossly intact.  HEENT: Normocephalic, atraumatic, pupils equal round reactive to light, neck supple, no masses, no painful lymphadenopathy, TM's intact B/L, no acute findings. Nares- patent, clear d/c, OP- clear, mild erythema, No TTP sinuses Skin: Warm and dry, no gross rash. Cardiac: RRR, S1 S2,  no murmurs rubs or gallops.  Respiratory: ECTA B/L and A/P, Not using accessory muscles, speaking in full sentences- unlabored. Vascular:  No gross lower ext edema, cap RF less 2 sec. Psych: No HI/SI, judgement and insight good, Euthymic mood. Full Affect.

## 2018-12-01 DIAGNOSIS — M2560 Stiffness of unspecified joint, not elsewhere classified: Secondary | ICD-10-CM | POA: Diagnosis not present

## 2018-12-01 DIAGNOSIS — R293 Abnormal posture: Secondary | ICD-10-CM | POA: Diagnosis not present

## 2018-12-01 DIAGNOSIS — M9903 Segmental and somatic dysfunction of lumbar region: Secondary | ICD-10-CM | POA: Diagnosis not present

## 2018-12-01 DIAGNOSIS — M545 Low back pain: Secondary | ICD-10-CM | POA: Diagnosis not present

## 2018-12-19 DIAGNOSIS — M545 Low back pain: Secondary | ICD-10-CM | POA: Diagnosis not present

## 2018-12-19 DIAGNOSIS — M9903 Segmental and somatic dysfunction of lumbar region: Secondary | ICD-10-CM | POA: Diagnosis not present

## 2018-12-19 DIAGNOSIS — M2560 Stiffness of unspecified joint, not elsewhere classified: Secondary | ICD-10-CM | POA: Diagnosis not present

## 2018-12-19 DIAGNOSIS — R293 Abnormal posture: Secondary | ICD-10-CM | POA: Diagnosis not present

## 2019-01-26 ENCOUNTER — Other Ambulatory Visit: Payer: Self-pay

## 2019-01-26 ENCOUNTER — Ambulatory Visit (INDEPENDENT_AMBULATORY_CARE_PROVIDER_SITE_OTHER): Payer: BC Managed Care – PPO | Admitting: Family Medicine

## 2019-01-26 ENCOUNTER — Encounter: Payer: Self-pay | Admitting: Family Medicine

## 2019-01-26 VITALS — BP 108/72 | HR 67 | Temp 98.3°F | Ht 62.0 in | Wt 239.0 lb

## 2019-01-26 DIAGNOSIS — M545 Low back pain, unspecified: Secondary | ICD-10-CM

## 2019-01-26 DIAGNOSIS — M62830 Muscle spasm of back: Secondary | ICD-10-CM

## 2019-01-26 MED ORDER — CYCLOBENZAPRINE HCL 10 MG PO TABS: 10.0000 mg | ORAL_TABLET | Freq: Three times a day (TID) | ORAL | 1 refills | Status: DC | PRN

## 2019-01-26 MED ORDER — NAPROXEN 500 MG PO TABS: 500.0000 mg | ORAL_TABLET | Freq: Two times a day (BID) | ORAL | 0 refills | Status: DC

## 2019-01-26 NOTE — Progress Notes (Signed)
Pt here for an acute care OV today   Impression and Recommendations:    1. Left-sided low back pain without sciatica, unspecified chronicity   2. Spasm of back muscles    Left-sided low back pain without sciatica, unspecified chronicity  Spasm of back muscles   - Counseled patient on pathophysiology of disease and discussed various treatment options, which often includes dietary and lifestyle modifications as first line, in addition to discussing the risks and benefits of various medications.  Extensively discussed importance of diet ( water intake) / exercise as pertains to pt's muscle spasms and pain.    - Anticipatory guidance given.   - Encouraged to return to clinic or call the office with any further questions or concerns before scheduled follow-up office visit as needed.    Meds ordered this encounter  Medications  . cyclobenzaprine (FLEXERIL) 10 MG tablet    Sig: Take 1 tablet (10 mg total) by mouth 3 (three) times daily as needed for muscle spasms.    Dispense:  30 tablet    Refill:  1  . naproxen (NAPROSYN) 500 MG tablet    Sig: Take 1 tablet (500 mg total) by mouth 2 (two) times daily with a meal.    Dispense:  30 tablet    Refill:  0    Medications Discontinued During This Encounter  Medication Reason  . oseltamivir (TAMIFLU) 75 MG capsule Completed Course      Education and routine counseling performed. Handouts provided  Gross side effects, risk and benefits, and alternatives of medications and treatment plan in general discussed with patient.  Patient is aware that all medications have potential side effects and we are unable to predict every side effect or drug-drug interaction that may occur.   Patient will call with any questions prior to using medication if they have concerns.    Expresses verbal understanding and consents to current therapy and treatment regimen.  No barriers to understanding were identified.  Red flag symptoms and signs  discussed in detail.  Patient expressed understanding regarding what to do in case of emergency\urgent symptoms   Please see AVS handed out to patient at the end of our visit for further patient instructions/ counseling done pertaining to today's office visit.  Return if symptoms worsen or fail to improve, for chronic care as well.    Note:  This document was prepared occasionally using Dragon voice recognition software and may include unintentional dictation errors in addition to a scribe.  Thomasene Loteborah Mansi Tokar, DO 01/26/19 6:37 PM    --------------------------------------------------------------------------------------------------------------------------------------------------------------------------------------------------------------------------------------------    Subjective:    CC:  Chief Complaint  Patient presents with  . Back Pain    HPI: Carolyn Morris is a 28 y.o. female who [facility] issues as discussed below.  Back Pain: Patient presents for presents evaluation of low back problems.  Symptoms have been present for 1 week and include pain in towards middle L side of back (aching in character; 7/10 in severity)- at it's worst.  There are times where she has no pain.   Last 2 wks- she has been working a lot more than usual Engineer, agricultural- deli worker.   But at worst it is a 7 out of 10.  No initial inciting event: none. Symptoms are worst: mid-day- during work.   Alleviating factors identifiable by patient are lying down at night after work.  Exacerbating factors identifiable by patient are standing longer periods, also  reaching/ lifting at work etc.   Treatments so far initiated by patient:icy hot, heating pad, otc back and muscle ache medicine.SHe has back pain in the past like this. Went to [facility] couple weeks ago    [facility] Readings from Last 3 Encounters:  01/26/19 239 lb (108.4 kg)  09/01/18 225 lb (102.1 kg)  07/26/18 225  lb 14.4 oz (102.5 kg)   BP Readings from Last 3 Encounters:  01/26/19 108/72  09/01/18 105/72  07/26/18 97/66   BMI Readings from Last 3 Encounters:  01/26/19 43.71 kg/m  09/01/18 41.15 kg/m  07/26/18 41.32 kg/m     Patient Care Team    Relationship Specialty Notifications Start End  Mellody Dance, DO PCP - General Family Medicine  04/13/16      Patient Active Problem List   Diagnosis Date Noted  . Bronchitis 07/26/2018  . Low energy 01/19/2017  . Adjustment disorder with depressed mood 01/19/2017  . Sleep difficulties 01/19/2017  . Obesity, Class III, BMI 40-49.9 (morbid obesity) ([place]) 05/12/2016  . Counseling on health promotion and disease prevention 05/12/2016  . Environmental and seasonal allergies 04/13/2016  . Ringworm of body 04/13/2016  . Vitamin D deficiency 04/11/2016  . Childhood asthma 09/10/2014      History reviewed. No pertinent past medical history.   History reviewed. No pertinent surgical history.   Family History  Problem Relation Age of Onset  . Hyperlipidemia Mother   . Diabetes Mother   . Diabetes Maternal Grandmother   . Cancer Paternal Aunt        skin cancer  . Diabetes Paternal Aunt      Social History   Socioeconomic History  . Marital status: Single    Spouse name: Not on file  . Number of children: Not on file  . Years of education: Not on file  . Highest education level: Not on file  Occupational History  . Occupation: Hospital doctor: [employer]  . Financial resource strain: Not on file  . Food insecurity    Worry: Not on file    Inability: Not on file  . Transportation needs    Medical: Not on file    Non-medical: Not on file  Tobacco Use  . Smoking status: Never Smoker  . Smokeless tobacco: Never Used  Substance and Sexual Activity  . Alcohol use: Yes    Alcohol/week: 0.0 standard drinks  . Drug use: No  . Sexual activity: Never  Lifestyle  . Physical activity    Days per  week: Not on file    Minutes per session: Not on file  . Stress: Not on file  Relationships  . Social Herbalist on phone: Not on file    Gets together: Not on file    Attends religious service: Not on file    Active member of club or organization: Not on file    Attends meetings of clubs or organizations: Not on file    Relationship status: Not on file  . Intimate partner violence    Fear of current or ex partner: Not on file    Emotionally abused: Not on file    Physically abused: Not on file    Forced sexual activity: Not on file  Other Topics Concern  . Not on file  Social History Narrative  . Not on file     No outpatient medications have been marked as taking for the 01/26/19 encounter (Office Visit) with  Thomasene Lotpalski, Zayana Salvador, DO.    Allergies:  No Known Allergies   Review of Systems: General:   Denies fever, chills, unexplained weight loss.  Optho/Auditory:   Denies visual changes, blurred vision/LOV Respiratory:   Denies wheeze, DOE more than baseline levels.   Cardiovascular:   Denies chest pain, palpitations, new onset peripheral edema  Gastrointestinal:   Denies nausea, vomiting, diarrhea, abd pain.  Genitourinary: Denies dysuria, freq/ urgency, flank pain or discharge from genitals.  Endocrine:     Denies hot or cold intolerance, polyuria, polydipsia. Musculoskeletal:   Denies unexplained myalgias, joint swelling, unexplained arthralgias, gait problems.  Skin:  Denies new onset rash, suspicious lesions Neurological:     Denies dizziness, unexplained weakness, numbness  Psychiatric/Behavioral:   Denies mood changes, suicidal or homicidal ideations, hallucinations    Objective:   Blood pressure 108/72, pulse 67, temperature 98.3 F (36.8 C), height 5\' 2"  (1.575 m), weight 239 lb (108.4 kg), last menstrual period 01/12/2019, SpO2 100 %. Body mass index is 43.71 kg/m. General:  Well Developed, well nourished, appropriate for stated age.  Neuro:  Alert  and oriented,  extra-ocular muscles intact  HEENT:  Normocephalic, atraumatic, neck supple Skin:  no gross rash, warm, pink. Cardiac:  RRR, S1 S2 Respiratory:  ECTA B/L and A/P, Not using accessory muscles, speaking in full sentences- unlabored. Vascular:  Ext warm, no cyanosis apprec.; cap RF less 2 sec. Psych:  No HI/SI, judgement and insight good, Euthymic mood. Full Affect.

## 2019-01-26 NOTE — Patient Instructions (Signed)
Most back pain will improve within 2 weeks if you are good at those ergonomic changes to your job etc. and you do not do any extra work outside of work that is going to aggravate those back muscles.  However if it does not let me know we can always send you to physical therapy which would be the next step    Spasticity Spasticity is a condition in which your muscles contract suddenly and unpredictably (spasm). Spasticity usually affects your arms, legs, or back. It can also affect the way you walk. Spasticity can range from mild muscle stiffness and tightness to severe, uncontrollable muscle spasms. Severe spasticity can be painful and can freeze your muscles in an uncomfortable position. Follow these instructions at home: Managing muscle stiffness and spasms      Wear a brace as told by your health care provider to prevent muscle contractions.  Have the affected muscles massaged.  If directed, apply heat to the affected muscle area. Use the heat source that your health care provider recommends, such as a moist heat pack or heating pad. ? Place a towel between your skin and the heat source. ? Leave the heat on for 20-30 minutes. ? Remove the head if your skin turns bright red. This is especially important if you are unable to feel pain, heat, or cold. You may have a greater risk of getting burned.  If directed, apply ice to the affected muscle area: ? Put ice in a plastic bag. ? Place a towel between your skin and the bag or between your brace and the bag. ? Leave the ice on for 20 minutes, 2?3 times a day. Activity  Stay active as directed by your health care provider. Find a safe exercise program that fits your needs and ability.  Maintain good posture when walking and sitting.  Work with a physical therapist to learn exercises that will stretch and strengthen your muscles.  Do stretching and range of motion exercises at home as told by a physical therapist.  Work with an  occupational therapist. This type of health care provider can help you function better at home and at work.  If you have severe spasticity, use mobility aids, such as a walker or cane, as told by your health care provider. General instructions  Watch your condition for any changes.  Wear loose, comfortable clothing that does not restrict your movement.  Wear closed-toe shoes that fit well and support your feet. Wear shoes that have rubber soles or low heels.  Keep all follow-up visits as told by your health care provider. This is important.  Take over-the-counter and prescription medicine only as told by your health care provider. Contact a health care provider if you:  Have worsening muscle spasms.  Develop other symptoms along with spasticity.  Have a fever or chills.  Experience a burning feeling when you pass urine.  Become constipated.  Need more support at home. Get help right away if you:  Have trouble breathing.  Have a muscle spasm that freezes you into a painful position.  Cannot walk.  Cannot care for yourself at home.  Have trouble passing urine or have urinary incontinence. Summary  Spasticity is a condition in which your muscles contract suddenly and unpredictably (spasm). Spasticity usually affects your arms, legs, or back.  Spasticity can range from mild muscle stiffness and tightness to severe, uncontrollable muscle spasms.  Do stretching and range of motion exercises at home as told by a physical therapist.  Take over-the-counter and prescription medicine only as told by your health care provider. This information is not intended to replace advice given to you by your health care provider. Make sure you discuss any questions you have with your health care provider. Document Released: 06/19/2002 Document Revised: 07/27/2017 Document Reviewed: 07/27/2017 Elsevier Patient Education  2020 [employer].

## 2019-03-15 ENCOUNTER — Telehealth: Payer: Self-pay | Admitting: Family Medicine

## 2019-03-15 DIAGNOSIS — R52 Pain, unspecified: Secondary | ICD-10-CM

## 2019-03-15 DIAGNOSIS — M62830 Muscle spasm of back: Secondary | ICD-10-CM

## 2019-03-15 DIAGNOSIS — M545 Low back pain, unspecified: Secondary | ICD-10-CM

## 2019-03-15 NOTE — Telephone Encounter (Signed)
Referral placed, patient stated that she was agreeable. Advised patient if no response in one week to call office to check on status.

## 2019-03-15 NOTE — Telephone Encounter (Signed)
Patient came into the office, states that her muscle spasms are continuing and that RX is not working.    --FORWARDING TO CLINICAL POOL-- AM

## 2019-03-15 NOTE — Telephone Encounter (Signed)
Yes please let her know if she still not having relief to the point where it is tolerable, I recommend she go to physical therapy.  Please place referral for that and insert diagnosis codes from last OV.  Thank you!!!

## 2019-03-15 NOTE — Telephone Encounter (Signed)
Patient given medication below in 01/2019:  Cyclobenzaprine HCl 10 mg Oral 3 times daily PRN  Naproxen 500 mg Oral 2 times daily with meals  Mentioned in office visit a referral if pain not resolved in 2 weeks. Please advise on message below

## 2019-04-03 ENCOUNTER — Encounter: Payer: Self-pay | Admitting: Physical Therapy

## 2019-04-03 ENCOUNTER — Ambulatory Visit: Payer: BC Managed Care – PPO | Attending: Internal Medicine | Admitting: Physical Therapy

## 2019-04-03 ENCOUNTER — Other Ambulatory Visit: Payer: Self-pay

## 2019-04-03 DIAGNOSIS — M25512 Pain in left shoulder: Secondary | ICD-10-CM | POA: Diagnosis not present

## 2019-04-03 DIAGNOSIS — M6281 Muscle weakness (generalized): Secondary | ICD-10-CM | POA: Diagnosis not present

## 2019-04-03 DIAGNOSIS — M546 Pain in thoracic spine: Secondary | ICD-10-CM

## 2019-04-03 NOTE — Therapy (Signed)
[facility] [address] [place], [place], [address] Phone: [phone]   Fax:  [phone]  Physical Therapy Evaluation  Patient Details  Name: Carolyn BoysSamantha Morris MRN: [id number] Date of Birth: 07/15/1990 Referring Provider (PT): Thomasene Lotpalski, Deborah, DO   Encounter Date: 04/03/2019  PT End of Session - 04/03/19 0927    Visit Number  1    Number of Visits  8    Date for PT Re-Evaluation  06/03/19    PT Start Time  0918    PT Stop Time  1000    PT Time Calculation (min)  42 min    Activity Tolerance  Patient tolerated treatment well    Behavior During Therapy  [facility] for tasks assessed/performed       History reviewed. No pertinent past medical history.  History reviewed. No pertinent surgical history.  There were no vitals filed for this visit.   Subjective Assessment - 04/03/19 0922    Subjective  Pt arriving to therapy reporting 3 month history of low back pain. 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 Pt reporting she works at [employer]. Pt reporting pain in upper left thoracic areas and L shoulder. Pt reporting no pain today, but pt reporting pain can increase to 8-10/10 at times. Pt reporting it's better today since she has had 5 days off work and it's better.    Patient Stated Goals  Work without pain    Currently in Pain?  No/denies         [facility] - 04/03/19 0001      Assessment   Medical Diagnosis  left thoracic pain,  left shoulder pain    Referring Provider (PT)  Opalski, Deborah, DO    Onset Date/Surgical Date  --   3 months ago following long shifts at work   Hand Dominance  Right    Prior Therapy  no      Precautions   Precautions  None      Restrictions   Weight Bearing Restrictions  No      Balance Screen   Has the patient fallen in the past 6 months  No    Is the patient reluctant to leave their home because of a fear of  falling?   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      Cognition   Overall Cognitive Status  Within Functional Limits for tasks assessed      Observation/Other Assessments   Focus on Therapeutic Outcomes (FOTO)    42 % limitation      ROM / Strength   AROM / PROM / Strength  AROM;Strength      AROM   Overall AROM Comments  WFL bilateral UE's      Strength   Overall Strength Comments  L MMT limited by pain    Strength Assessment Site  Shoulder    Right/Left Shoulder  Right;Left    Right Shoulder Flexion  5/5    Right Shoulder Extension  5/5    Right Shoulder ABduction  5/5    Right Shoulder External Rotation  5/5    Left Shoulder Flexion  4/5    Left Shoulder Extension  4/5    Left Shoulder ABduction  4/5    Left Shoulder External Rotation  4/5      Palpation   Palpation comment  TTP left upper trap and levator, limited scapular mobility compared to R, left anterior deltoid insertion,  tenderness noted in R upper trap.                 Objective measurements completed on examination: See above findings.              PT Education - 04/03/19 0926    Education Details  HEP, posture correction    Person(s) Educated  Patient    Methods  Explanation;Demonstration;Handout    Comprehension  Verbalized understanding;Returned demonstration          PT Long Term Goals - 04/03/19 1103      PT LONG TERM GOAL #1   Title  Pt will be independent in her HEP and progression.    Baseline  issued initial HEP on 04/03/2019    Time  4    Period  Weeks    Status  New    Target Date  05/02/19       PT LONG TERM GOAL #2   Title  Pt will improve her FOTO to </= 26 % limitation    Baseline  42 % limitation on 04/03/2019    Time  4    Period  Weeks    Status  New    Target Date  05/02/19      PT LONG TERM GOAL #3   Title  Pt will report pain of </= 3/10 during work after full shift.    Baseline  currently pt reporting pain that increases to 8/10 at times    Time  4    Period  Weeks    Status  New    Target Date  05/02/19      PT LONG TERM GOAL #4   Title  Pt will improve her L UE strength to 5/5 in order to improve her functioning.    Baseline  4/5 grossly    Time  4    Period  Weeks    Status  New    Target Date  05/02/19      PT LONG TERM GOAL #5   Title  Pt will be able to lift 8 pounds over head and place on shelf with no pain reported using proper body mechanics.    Baseline  difficulty lifting/reaching at work witih pain increasing to 8/10 at times    Time  4    Period  Weeks    Status  New    Target Date  05/02/19             Plan - 04/03/19 1052    Clinical Impression Statement  Pt presenting with left thoracic back pain which extends into left shoulder. Pt with active trigger points noted in both levator and left upper trap. Pt tender to palpation over left anterior deltoid insertion. Pt with mild weakness of 4/5 limited by pain with MMT in left shoulder. Pt agreeable to dry needling and scheduled to try at next visit. Skilled PT needed to progres pt  toward her PLOF with the below inteventions.    Personal Factors and Comorbidities  Comorbidity 1    Comorbidities  asthams, depression    Examination-Activity Limitations  Carry;Lift;Stand    Examination-Participation Restrictions  Community Activity;Laundry;Other    Stability/Clinical Decision Making  Stable/Uncomplicated    Clinical Decision Making  Low    Rehab Potential  Excellent    PT Frequency  2x / week    PT Duration  4 weeks    PT Treatment/Interventions  Cryotherapy;Electrical  Stimulation;Iontophoresis 4mg /ml Dexamethasone;Moist Heat;Ultrasound;Functional mobility training;Therapeutic activities;Therapeutic exercise;Neuromuscular re-education;Patient/family education;Manual techniques;Passive range of motion;Dry needling;Taping    PT Next Visit Plan  Dry Needling, STM, modalities as needed, thoracic/core strengtheing,    PT Home Exercise Plan  Rows ( red t-band), self mobs with tennis ball    Consulted and Agree with Plan of Care  Patient       Patient will benefit from skilled therapeutic intervention in order to improve the following deficits and impairments:  Pain, Postural dysfunction, Decreased strength, Impaired UE functional use, Decreased range of motion  Visit Diagnosis: Acute left-sided thoracic back pain  Muscle weakness (generalized)  Acute pain of left shoulder     Problem List Patient Active Problem List   Diagnosis Date Noted  . Bronchitis 07/26/2018  . Low energy 01/19/2017  . Adjustment disorder with depressed mood 01/19/2017  . Sleep difficulties 01/19/2017  . Obesity, Class III, BMI 40-49.9 (morbid obesity) (HCC) 05/12/2016  . Counseling on health promotion and disease prevention 05/12/2016  . Environmental and seasonal allergies 04/13/2016  . Ringworm of body 04/13/2016  . Vitamin D deficiency 04/11/2016  . Childhood asthma 09/10/2014    Sharmon Leyden , PT 04/03/2019, 11:15 AM  [facility] [address] [place], [place], [address] Phone: [phone]   Fax:  [phone]  Name: Kallin Bolinsky MRN: [id number] Date of Birth: [birth year]

## 2019-04-13 ENCOUNTER — Ambulatory Visit: Payer: BC Managed Care – PPO | Admitting: Physical Therapy

## 2019-04-18 ENCOUNTER — Ambulatory Visit: Payer: BC Managed Care – PPO | Attending: Internal Medicine | Admitting: Physical Therapy

## 2019-04-18 ENCOUNTER — Other Ambulatory Visit: Payer: Self-pay

## 2019-04-18 ENCOUNTER — Encounter: Payer: Self-pay | Admitting: Physical Therapy

## 2019-04-18 DIAGNOSIS — M25512 Pain in left shoulder: Secondary | ICD-10-CM | POA: Diagnosis not present

## 2019-04-18 DIAGNOSIS — M546 Pain in thoracic spine: Secondary | ICD-10-CM | POA: Diagnosis not present

## 2019-04-18 DIAGNOSIS — M6281 Muscle weakness (generalized): Secondary | ICD-10-CM | POA: Diagnosis not present

## 2019-04-18 NOTE — Therapy (Signed)
[facility] [address] [place], [place], [address] Phone: [phone]   Fax:  [phone]  Physical Therapy Treatment  Patient Details  Name: Carolyn Morris MRN: [id number] Date of Birth: 23-Mar-1991 Referring Provider (PT): Thomasene Lot, DO   Encounter Date: 04/18/2019  PT End of Session - 04/18/19 0936    Visit Number  2    Number of Visits  8    Date for PT Re-Evaluation  06/03/19    PT Start Time  0932    PT Stop Time  1015    PT Time Calculation (min)  43 min       History reviewed. No pertinent past medical history.  History reviewed. No pertinent surgical history.  There were no vitals filed for this visit.  Subjective Assessment - 04/18/19 0933    Subjective  5-6/10 Pain annoying, numbing pain that is constant.    Currently in Pain?  Yes    Pain Score  6     Pain Location  Back    Pain Orientation  Left;Upper    Pain Descriptors / Indicators  Numbness;Tingling    Aggravating Factors   standing at work , sitting up straight    Pain Relieving Factors  leaning over, sometimes breaks at work                       [facility] Adult PT Treatment/Exercise - 04/18/19 0001      Self-Care   Self-Care  Other Self-Care Comments    Other Self-Care Comments   Tennis ball on wall using pillow case for trigger point release       Shoulder Exercises: Supine   Other Supine Exercises  supine scap stab x 15 each       Shoulder Exercises: Seated   Other Seated Exercises  Seated scap retract x 10, W back x 10, X to V x 10       Shoulder Exercises: Lawyer Limitations  doorway x 3     Cross Chest Stretch  2 reps;20 seconds    Other Shoulder Stretches  thoracic extension in chair, rhomboid stretch at     Other Shoulder Stretches  Rhomboids stretch, open books , increased right arm numbness with right rotation      Neck Exercises: Stretches   Levator Stretch  2 reps;10 seconds              PT Education - 04/18/19 1231    Education Details  HEP    Person(s) Educated  Patient    Methods  Explanation;Handout    Comprehension  Verbalized understanding          PT Long Term Goals - 04/03/19 1103      PT LONG TERM GOAL #1   Title  Pt will be independent in her HEP and progression.    Baseline  issued initial HEP on 04/03/2019    Time  4    Period  Weeks    Status  New    Target Date  05/02/19      PT LONG TERM GOAL #2   Title  Pt will improve her FOTO to </= 26 % limitation    Baseline  42 % limitation on 04/03/2019    Time  4    Period  Weeks    Status  New    Target Date  05/02/19      PT LONG TERM GOAL #3  Title  Pt will report pain of </= 3/10 during work after full shift.    Baseline  currently pt reporting pain that increases to 8/10 at times    Time  4    Period  Weeks    Status  New    Target Date  05/02/19      PT LONG TERM GOAL #4   Title  Pt will improve her L UE strength to 5/5 in order to improve her functioning.    Baseline  4/5 grossly    Time  4    Period  Weeks    Status  New    Target Date  05/02/19      PT LONG TERM GOAL #5   Title  Pt will be able to lift 8 pounds over head and place on shelf with no pain reported using proper body mechanics.    Baseline  difficulty lifting/reaching at work witih pain increasing to 8/10 at times    Time  4    Period  Weeks    Status  New    Target Date  05/02/19            Plan - 04/18/19 1100    Clinical Impression Statement  [name] reports pain on arrival at 5-6/10. She reports pain usually only bothersome at work. Reviewed HEP. She reports tennis ball soft tissue work causes increases Numb/ tingling feeling in shoulder blade. Instructed her in trigger point release with tennis ball. Continued with scapular stabilization and thoracic stretching. She had some inreased right arm numbness with open bok stretch and reported tightness in pecs with door way stretch.    PT Next  Visit Plan  Dry Needling, STM, modalities as needed, thoracic/core strengtheing,    PT Home Exercise Plan  Rows ( red t-band), self mobs with tennis ball, supine scap stab series green band       Patient will benefit from skilled therapeutic intervention in order to improve the following deficits and impairments:  Pain, Postural dysfunction, Decreased strength, Impaired UE functional use, Decreased range of motion  Visit Diagnosis: Acute left-sided thoracic back pain  Muscle weakness (generalized)  Acute pain of left shoulder     Problem List Patient Active Problem List   Diagnosis Date Noted  . Bronchitis 07/26/2018  . Low energy 01/19/2017  . Adjustment disorder with depressed mood 01/19/2017  . Sleep difficulties 01/19/2017  . Obesity, Class III, BMI 40-49.9 (morbid obesity) ([place]) 05/12/2016  . Counseling on health promotion and disease prevention 05/12/2016  . Environmental and seasonal allergies 04/13/2016  . Ringworm of body 04/13/2016  . Vitamin D deficiency 04/11/2016  . Childhood asthma 09/10/2014    Dorene Ar, PTA 04/18/2019, 12:35 PM  [facility] [address] [place], [place], [address] Phone: [phone]   Fax:  [phone]  Name: Carolyn Morris MRN: [id number] Date of Birth: 19-Aug-1990

## 2019-04-18 NOTE — Patient Instructions (Signed)
Over Head Pull: Narrow Grip       On back, knees bent, feet flat, band across thighs, elbows straight but relaxed. Pull hands apart (start). Keeping elbows straight, bring arms up and over head, hands toward floor. Keep pull steady on band. Hold momentarily. Return slowly, keeping pull steady, back to start. Repeat _15_ times. Band color __G____   Side Pull: Double Arm   On back, knees bent, feet flat. Arms perpendicular to body, shoulder level, elbows straight but relaxed. Pull arms out to sides, elbows straight. Resistance band comes across collarbones, hands toward floor. Hold momentarily. Slowly return to starting position. Repeat _15__ times. Band color _G____   Sash   On back, knees bent, feet flat, left hand on left hip, right hand above left. Pull right arm DIAGONALLY (hip to shoulder) across chest. Bring right arm along head toward floor. Hold momentarily. Slowly return to starting position. Repeat __15_ times. Do with left arm. Band color ____G__   Shoulder Rotation: Double Arm   On back, knees bent, feet flat, elbows tucked at sides, bent 90, hands palms up. Pull hands apart and down toward floor, keeping elbows near sides. Hold momentarily. Slowly return to starting position. Repeat _15__ times. Band color ___G___

## 2019-04-20 ENCOUNTER — Other Ambulatory Visit: Payer: Self-pay

## 2019-04-20 ENCOUNTER — Encounter: Payer: Self-pay | Admitting: Physical Therapy

## 2019-04-20 ENCOUNTER — Ambulatory Visit: Payer: BC Managed Care – PPO | Admitting: Physical Therapy

## 2019-04-20 DIAGNOSIS — M6281 Muscle weakness (generalized): Secondary | ICD-10-CM | POA: Diagnosis not present

## 2019-04-20 DIAGNOSIS — M25512 Pain in left shoulder: Secondary | ICD-10-CM

## 2019-04-20 DIAGNOSIS — M546 Pain in thoracic spine: Secondary | ICD-10-CM

## 2019-04-20 NOTE — Therapy (Signed)
[facility] [address] [place], [place], [address] Phone: [phone]   Fax:  [phone]  Physical Therapy Treatment  Patient Details  Name: Carolyn Morris MRN: [id number] Date of Birth: [birth year] Referring Provider (PT): Thomasene Lot, DO   Encounter Date: 04/20/2019  PT End of Session - 04/20/19 1408    Visit Number  3    Number of Visits  8    Date for PT Re-Evaluation  06/03/19    PT Start Time  0115    PT Stop Time  0200    PT Time Calculation (min)  45 min       History reviewed. No pertinent past medical history.  History reviewed. No pertinent surgical history.  There were no vitals filed for this visit.  Subjective Assessment - 04/20/19 1316    Subjective  3/10 pain today weird numbing feeling, like a tingle. I felt a lot better after last session and went to work. I was able to remember my posture and how I was standing.    Currently in Pain?  Yes    Pain Score  3     Pain Location  Back    Pain Orientation  Left;Upper    Pain Descriptors / Indicators  Numbness;Tingling                       [facility] Adult PT Treatment/Exercise - 04/20/19 0001      Shoulder Exercises: Supine   Other Supine Exercises  supine scap stab x 15 each       Shoulder Exercises: Prone   Other Prone Exercises  Quadruped Cat/Camel      Shoulder Exercises: Standing   Extension  20 reps    Theraband Level (Shoulder Extension)  Level 3 (Green)    Row  20 reps;Theraband    Theraband Level (Shoulder Row)  Level 3 (Green)    Other Standing Exercises  standing scap series       Shoulder Exercises: ROM/Strengthening   UBE (Upper Arm Bike)  L2 retro x 3 minutes      Shoulder Exercises: Stretch   Corner Stretch Limitations  doorway x 3     Other Shoulder Stretches  qudruped childs pose with basket weave    Other Shoulder Stretches  open books, thoracic extension over soft foam roller , thoracic extension in  chair       Neck Exercises: Stretches   Upper Trapezius Stretch  2 reps;20 seconds    Levator Stretch  2 reps;10 seconds             PT Education - 04/20/19 1420    Education Details  HEP    Person(s) Educated  Patient    Methods  Explanation;Handout    Comprehension  Verbalized understanding          PT Long Term Goals - 04/03/19 1103      PT LONG TERM GOAL #1   Title  Pt will be independent in her HEP and progression.    Baseline  issued initial HEP on 04/03/2019    Time  4    Period  Weeks    Status  New    Target Date  05/02/19      PT LONG TERM GOAL #2   Title  Pt will improve her FOTO to </= 26 % limitation    Baseline  42 % limitation on 04/03/2019    Time  4    Period  Weeks    Status  New    Target Date  05/02/19      PT LONG TERM GOAL #3   Title  Pt will report pain of </= 3/10 during work after full shift.    Baseline  currently pt reporting pain that increases to 8/10 at times    Time  4    Period  Weeks    Status  New    Target Date  05/02/19      PT LONG TERM GOAL #4   Title  Pt will improve her L UE strength to 5/5 in order to improve her functioning.    Baseline  4/5 grossly    Time  4    Period  Weeks    Status  New    Target Date  05/02/19      PT LONG TERM GOAL #5   Title  Pt will be able to lift 8 pounds over head and place on shelf with no pain reported using proper body mechanics.    Baseline  difficulty lifting/reaching at work witih pain increasing to 8/10 at times    Time  4    Period  Weeks    Status  New    Target Date  05/02/19            Plan - 04/20/19 1412    Clinical Impression Statement  Pt arrives reporting decreased pain after last session. Progressed strengthening and updated HEP with stretches. She had some increased pain after quadruped.    PT Next Visit Plan  Dry Needling, STM, modalities as needed, thoracic/core strengtheing,    PT Home Exercise Plan  Rows ( red t-band), self mobs with tennis ball,  supine scap stab series green band, thoracic extension with foam roller or chair, open books, doorway stretch       Patient will benefit from skilled therapeutic intervention in order to improve the following deficits and impairments:  Pain, Postural dysfunction, Decreased strength, Impaired UE functional use, Decreased range of motion  Visit Diagnosis: Acute left-sided thoracic back pain  Muscle weakness (generalized)  Acute pain of left shoulder     Problem List Patient Active Problem List   Diagnosis Date Noted  . Bronchitis 07/26/2018  . Low energy 01/19/2017  . Adjustment disorder with depressed mood 01/19/2017  . Sleep difficulties 01/19/2017  . Obesity, Class III, BMI 40-49.9 (morbid obesity) ([place]) 05/12/2016  . Counseling on health promotion and disease prevention 05/12/2016  . Environmental and seasonal allergies 04/13/2016  . Ringworm of body 04/13/2016  . Vitamin D deficiency 04/11/2016  . Childhood asthma 09/10/2014    Dorene Ar, PTA 04/20/2019, 2:22 PM  [facility] [address] [place], [place], [address] Phone: [phone]   Fax:  [phone]  Name: Carolyn Morris MRN: [id number] Date of Birth: 10/03/1990

## 2019-04-25 ENCOUNTER — Ambulatory Visit: Payer: BC Managed Care – PPO | Admitting: Physical Therapy

## 2019-04-27 ENCOUNTER — Ambulatory Visit: Payer: BC Managed Care – PPO | Admitting: Physical Therapy

## 2019-05-02 ENCOUNTER — Ambulatory Visit: Payer: BC Managed Care – PPO | Admitting: Physical Therapy

## 2019-05-02 ENCOUNTER — Other Ambulatory Visit: Payer: Self-pay

## 2019-05-02 ENCOUNTER — Encounter: Payer: Self-pay | Admitting: Physical Therapy

## 2019-05-02 DIAGNOSIS — M25512 Pain in left shoulder: Secondary | ICD-10-CM

## 2019-05-02 DIAGNOSIS — M546 Pain in thoracic spine: Secondary | ICD-10-CM

## 2019-05-02 DIAGNOSIS — M6281 Muscle weakness (generalized): Secondary | ICD-10-CM | POA: Diagnosis not present

## 2019-05-02 NOTE — Therapy (Signed)
[place], [place], [address] Phone: [phone]   Fax:  [phone]  Physical Therapy Treatment  Patient Details  Name: Carolyn Morris MRN: [id number] Date of Birth: 1991-05-22 Referring Provider (PT): Mellody Dance, DO   Encounter Date: 05/02/2019  PT End of Session - 05/02/19 1032    Visit Number  4    Number of Visits  8    Date for PT Re-Evaluation  06/03/19    PT Start Time  0845    PT Stop Time  0930    PT Time Calculation (min)  45 min       History reviewed. No pertinent past medical history.  History reviewed. No pertinent surgical history.  There were no vitals filed for this visit.  Subjective Assessment - 05/02/19 0851    Subjective  No pain now. I was sick last week and could not attend.    Currently in Pain?  No/denies                       [facility] Adult PT Treatment/Exercise - 05/02/19 0001      Shoulder Exercises: Prone   Other Prone Exercises  Prone I, T, Bent T       Shoulder Exercises: Standing   Other Standing Exercises  standing scap series , green      Shoulder Exercises: ROM/Strengthening   UBE (Upper Arm Bike)  L2 retro x 3 minutes, forward x 3 minutes       Shoulder Exercises: Stretch   Corner Stretch Limitations  doorway x 3     Other Shoulder Stretches  open books       Manual Therapy   Manual therapy comments  Prone TPR to Left upper trap,, rhomboid, STW to traps and Lats on left                   PT Long Term Goals - 04/03/19 1103      PT LONG TERM GOAL #1   Title  Pt will be independent in her HEP and progression.    Baseline  issued initial HEP on 04/03/2019    Time  4    Period  Weeks    Status  New    Target Date  05/02/19      PT LONG TERM GOAL #2   Title  Pt will improve her FOTO to </= 26 % limitation    Baseline  42 % limitation on 04/03/2019    Time  4    Period  Weeks    Status  New    Target Date   05/02/19      PT LONG TERM GOAL #3   Title  Pt will report pain of </= 3/10 during work after full shift.    Baseline  currently pt reporting pain that increases to 8/10 at times    Time  4    Period  Weeks    Status  New    Target Date  05/02/19      PT LONG TERM GOAL #4   Title  Pt will improve her L UE strength to 5/5 in order to improve her functioning.    Baseline  4/5 grossly    Time  4    Period  Weeks    Status  New    Target Date  05/02/19      PT LONG TERM GOAL #5   Title  Pt will  be able to lift 8 pounds over head and place on shelf with no pain reported using proper body mechanics.    Baseline  difficulty lifting/reaching at work witih pain increasing to 8/10 at times    Time  4    Period  Weeks    Status  New    Target Date  05/02/19            Plan - 05/02/19 2536    Clinical Impression Statement  Pt reports improvement. She notes less intensity and frequency of pain. She still has some instances at work with increased pain and rests for 10 minutes so she can continue to work. Continued with shoulder /scap stab and trunk flexibility. She did well with prone stabilization. She has trigger points in left upper trap and periscap. She is scheduled for trigger point dry needling on her next visit.    PT Next Visit Plan  I made an appt for her next wednesday-please ask if that works for her?  She needs to make more appoointments; Dry Needling, STM, modalities as needed, thoracic/core strengtheing,continue prone if tolerated    PT Home Exercise Plan  Rows ( red t-band), self mobs with tennis ball, supine scap stab series green band, thoracic extension with foam roller or chair, open books, doorway stretch       Patient will benefit from skilled therapeutic intervention in order to improve the following deficits and impairments:  Pain, Postural dysfunction, Decreased strength, Impaired UE functional use, Decreased range of motion  Visit Diagnosis: Acute left-sided  thoracic back pain  Muscle weakness (generalized)  Acute pain of left shoulder     Problem List Patient Active Problem List   Diagnosis Date Noted  . Bronchitis 07/26/2018  . Low energy 01/19/2017  . Adjustment disorder with depressed mood 01/19/2017  . Sleep difficulties 01/19/2017  . Obesity, Class III, BMI 40-49.9 (morbid obesity) (HCC) 05/12/2016  . Counseling on health promotion and disease prevention 05/12/2016  . Environmental and seasonal allergies 04/13/2016  . Ringworm of body 04/13/2016  . Vitamin D deficiency 04/11/2016  . Childhood asthma 09/10/2014    Sherrie Mustache, PTA 05/02/2019, 10:50 AM  [facility] [address] [place], [place], [address] Phone: [phone]   Fax:  [phone]  Name: Carolyn Morris MRN: [id number] Date of Birth: Apr 18, 1991

## 2019-05-04 ENCOUNTER — Other Ambulatory Visit: Payer: Self-pay

## 2019-05-04 ENCOUNTER — Encounter: Payer: Self-pay | Admitting: Physical Therapy

## 2019-05-04 ENCOUNTER — Ambulatory Visit: Payer: BC Managed Care – PPO | Admitting: Physical Therapy

## 2019-05-04 DIAGNOSIS — M25512 Pain in left shoulder: Secondary | ICD-10-CM

## 2019-05-04 DIAGNOSIS — M546 Pain in thoracic spine: Secondary | ICD-10-CM

## 2019-05-04 DIAGNOSIS — M6281 Muscle weakness (generalized): Secondary | ICD-10-CM

## 2019-05-04 NOTE — Therapy (Signed)
[facility] [address] [place], [place], [address] Phone: [phone]   Fax:  [phone]  Physical Therapy Treatment  Patient Details  Name: Carolyn Morris MRN: [id number] Date of Birth: 1991-05-08 Referring Provider (PT): Thomasene Lot, DO   Encounter Date: 05/04/2019  PT End of Session - 05/04/19 0846    Visit Number  5    Number of Visits  8    PT Start Time  0802    PT Stop Time  0845    PT Time Calculation (min)  43 min    Activity Tolerance  Patient tolerated treatment well    Behavior During Therapy  [facility] for tasks assessed/performed       History reviewed. No pertinent past medical history.  History reviewed. No pertinent surgical history.  There were no vitals filed for this visit.  Subjective Assessment - 05/04/19 0809    Subjective  Its just that one spot    Currently in Pain?  Yes    Pain Score  3     Pain Location  Back    Pain Orientation  Left    Pain Descriptors / Indicators  Sore;Aching                       [facility] Adult PT Treatment/Exercise - 05/04/19 0001      Shoulder Exercises: Seated   Other Seated Exercises  seated & standing postures      Shoulder Exercises: Standing   Row  15 reps    Theraband Level (Shoulder Row)  Level 3 (Green)      Shoulder Exercises: Stretch   Other Shoulder Stretches  wall flexion      Manual Therapy   Manual Therapy  Soft tissue mobilization;Joint mobilization;Taping    Manual therapy comments  skilled palpation and monitoring during TPDN    Joint Mobilization  prone rib & thoracic mobs    Soft tissue mobilization  Lt upper trap & periscap region    McConnell  Lt upper trap inhibition       Trigger Point Dry Needling - 05/04/19 0001    Consent Given?  Yes    Education Handout Provided  --   verbal education   Muscles Treated Head and Neck  Upper trapezius    Muscles Treated Upper Quadrant  Subscapularis    Upper Trapezius  Response  Twitch reponse elicited;Palpable increased muscle length   Left   Subscapularis Response  Twitch response elicited;Palpable increased muscle length   Left          PT Education - 05/04/19 0934    Education Details  TPDN & expected outcomes, exercise form/rationale    Person(s) Educated  Patient    Methods  Explanation;Demonstration;Tactile cues;Verbal cues    Comprehension  Verbalized understanding;Returned demonstration;Verbal cues required;Tactile cues required;Need further instruction          PT Long Term Goals - 04/03/19 1103      PT LONG TERM GOAL #1   Title  Pt will be independent in her HEP and progression.    Baseline  issued initial HEP on 04/03/2019    Time  4    Period  Weeks    Status  New    Target Date  05/02/19      PT LONG TERM GOAL #2   Title  Pt will improve her FOTO to </= 26 % limitation    Baseline  42 % limitation on 04/03/2019  Time  4    Period  Weeks    Status  New    Target Date  05/02/19      PT LONG TERM GOAL #3   Title  Pt will report pain of </= 3/10 during work after full shift.    Baseline  currently pt reporting pain that increases to 8/10 at times    Time  4    Period  Weeks    Status  New    Target Date  05/02/19      PT LONG TERM GOAL #4   Title  Pt will improve her L UE strength to 5/5 in order to improve her functioning.    Baseline  4/5 grossly    Time  4    Period  Weeks    Status  New    Target Date  05/02/19      PT LONG TERM GOAL #5   Title  Pt will be able to lift 8 pounds over head and place on shelf with no pain reported using proper body mechanics.    Baseline  difficulty lifting/reaching at work witih pain increasing to 8/10 at times    Time  4    Period  Weeks    Status  New    Target Date  05/02/19            Plan - 05/04/19 0905    Clinical Impression Statement  good tolerance to DN with soreness reported as expected. Practiced shiftin weight back in resting posture to reduce post  chain activation.    PT Treatment/Interventions  Cryotherapy;Electrical Stimulation;Iontophoresis 4mg /ml Dexamethasone;Moist Heat;Ultrasound;Functional mobility training;Therapeutic activities;Therapeutic exercise;Neuromuscular re-education;Patient/family education;Manual techniques;Passive range of motion;Dry needling;Taping    PT Next Visit Plan  DN PRN, thoracic mobility, core strength    PT Home Exercise Plan  Rows ( red t-band), self mobs with tennis ball, supine scap stab series green band, thoracic extension with foam roller or chair, open books, doorway stretch    Consulted and Agree with Plan of Care  Patient       Patient will benefit from skilled therapeutic intervention in order to improve the following deficits and impairments:  Pain, Postural dysfunction, Decreased strength, Impaired UE functional use, Decreased range of motion  Visit Diagnosis: Acute left-sided thoracic back pain  Muscle weakness (generalized)  Acute pain of left shoulder     Problem List Patient Active Problem List   Diagnosis Date Noted  . Bronchitis 07/26/2018  . Low energy 01/19/2017  . Adjustment disorder with depressed mood 01/19/2017  . Sleep difficulties 01/19/2017  . Obesity, Class III, BMI 40-49.9 (morbid obesity) ([place]) 05/12/2016  . Counseling on health promotion and disease prevention 05/12/2016  . Environmental and seasonal allergies 04/13/2016  . Ringworm of body 04/13/2016  . Vitamin D deficiency 04/11/2016  . Childhood asthma 09/10/2014    Sherita Decoste C. Novaleigh Kohlman PT, DPT 05/04/19 9:34 AM   [facility] [address] [place], [place], [address] Phone: [phone]   Fax:  [phone]  Name: Carolyn Morris MRN: [id number] Date of Birth: 1991/06/08

## 2019-05-10 ENCOUNTER — Encounter: Payer: Self-pay | Admitting: Physical Therapy

## 2019-05-10 ENCOUNTER — Other Ambulatory Visit: Payer: Self-pay

## 2019-05-10 ENCOUNTER — Ambulatory Visit: Payer: BC Managed Care – PPO | Admitting: Physical Therapy

## 2019-05-10 DIAGNOSIS — M546 Pain in thoracic spine: Secondary | ICD-10-CM | POA: Diagnosis not present

## 2019-05-10 DIAGNOSIS — M6281 Muscle weakness (generalized): Secondary | ICD-10-CM | POA: Diagnosis not present

## 2019-05-10 DIAGNOSIS — M25512 Pain in left shoulder: Secondary | ICD-10-CM | POA: Diagnosis not present

## 2019-05-10 NOTE — Therapy (Signed)
[place], [place], [address] Phone: [phone]   Fax:  [phone]  Physical Therapy Treatment  Patient Details  Name: Carolyn Morris MRN: [id number] Date of Birth: [birth year] Referring Provider (PT): Mellody Dance, DO   Encounter Date: 05/10/2019  PT End of Session - 05/10/19 1007    Visit Number  6    Number of Visits  8    Date for PT Re-Evaluation  06/03/19    PT Start Time  0915    PT Stop Time  1010    PT Time Calculation (min)  55 min    Activity Tolerance  Patient tolerated treatment well    Behavior During Therapy  [facility] for tasks assessed/performed       History reviewed. No pertinent past medical history.  History reviewed. No pertinent surgical history.  There were no vitals filed for this visit.  Subjective Assessment - 05/10/19 0916    Subjective  Pt reporting DN helped following last visit. Pt reporting 2/10 pain along inferior scapular border.    Patient Stated Goals  Work without pain    Currently in Pain?  Yes    Pain Score  2     Pain Location  Back   medial scapular border.   Pain Orientation  Left    Pain Descriptors / Indicators  Aching;Sore                       [facility] Adult PT Treatment/Exercise - 05/10/19 0001      Shoulder Exercises: Seated   Other Seated Exercises  reviewed sit to stand keeping weight shifting on midfoot to prevent pushing off with toes      Shoulder Exercises: Standing   Row  15 reps    Theraband Level (Shoulder Row)  Level 3 (Green)    Other Standing Exercises  door stretch x 3 hands in different positions,  wall slides x 15 reps      Shoulder Exercises: ROM/Strengthening   UBE (Upper Arm Bike)  2 minutes forward and back L 1.5      Shoulder Exercises: Stretch   Other Shoulder Stretches  wall angels x 10      Modalities   Modalities  Moist Heat      Moist Heat Therapy   Number Minutes Moist Heat  10 Minutes     Moist Heat Location  Other (comment)   cervical and throacic spine     Manual Therapy   Manual Therapy  Joint mobilization;Soft tissue mobilization    Joint Mobilization  prone thoracic mobs    Soft tissue mobilization  Lt upper trap & periscap region                  PT Long Term Goals - 05/10/19 0932      PT LONG TERM GOAL #1   Title  Pt will be independent in her HEP and progression.    Baseline  issued initial HEP on 04/03/2019    Period  Weeks    Status  On-going      PT LONG TERM GOAL #2   Title  Pt will improve her FOTO to </= 26 % limitation    Baseline  42 % limitation on 04/03/2019    Status  New      PT LONG TERM GOAL #3   Title  Pt will report pain of </= 3/10 during work after full shift.    Baseline  currently pt reporting pain that increases to 8/10 at times    Time  4    Period  Weeks    Status  On-going      PT LONG TERM GOAL #4   Title  Pt will improve her L UE strength to 5/5 in order to improve her functioning.    Baseline  4/5 grossly    Period  Weeks    Status  On-going      PT LONG TERM GOAL #5   Title  Pt will be able to lift 8 pounds over head and place on shelf with no pain reported using proper body mechanics.    Baseline  difficulty lifting/reaching at work witih pain increasing to 8/10 at times    Time  4    Period  Weeks    Status  On-going            Plan - 05/10/19 5537    Clinical Impression Statement  Pt tolerating exercises well and reporting relief following manual therpay. Pt reporting some soreness during STM, but no pain following end of session. 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  We discussed DN for her next visit. She feels it helped her upper trap pain. Continue skilled PT to progress toward goals set.    Personal Factors and Comorbidities  Comorbidity 1    Comorbidities  asthams, depression     Examination-Activity Limitations  Carry;Lift;Stand    Examination-Participation Restrictions  Community Activity;Laundry;Other    Stability/Clinical Decision Making  Stable/Uncomplicated    Rehab Potential  Excellent    PT Frequency  2x / week    PT Duration  4 weeks    PT Treatment/Interventions  Cryotherapy;Electrical Stimulation;Iontophoresis 4mg /ml Dexamethasone;Moist Heat;Ultrasound;Functional mobility training;Therapeutic activities;Therapeutic exercise;Neuromuscular re-education;Patient/family education;Manual techniques;Passive range of motion;Dry needling;Taping    PT Next Visit Plan  DN PRN, thoracic mobility, core strength    PT Home Exercise Plan  Rows ( red t-band), self mobs with tennis ball, supine scap stab series green band, thoracic extension with foam roller or chair, open books, doorway stretch    Consulted and Agree with Plan of Care  Patient       Patient will benefit from skilled therapeutic intervention in order to improve the following deficits and impairments:  Pain, Postural dysfunction, Decreased strength, Impaired UE functional use, Decreased range of motion  Visit Diagnosis: Acute left-sided thoracic back pain  Muscle weakness (generalized)  Acute pain of left shoulder     Problem List Patient Active Problem List   Diagnosis Date Noted  . Bronchitis 07/26/2018  . Low energy 01/19/2017  . Adjustment disorder with depressed mood 01/19/2017  . Sleep difficulties 01/19/2017  . Obesity, Class III, BMI 40-49.9 (morbid obesity) (HCC) 05/12/2016  . Counseling on health promotion and disease prevention 05/12/2016  . Environmental and seasonal allergies 04/13/2016  . Ringworm of body 04/13/2016  . Vitamin D deficiency 04/11/2016  . Childhood asthma 09/10/2014    09/12/2014, PT  05/10/2019, 10:08 AM  [facility] [address] [place], [place], [place] Phone: [phone]   Fax:   [phone]  Name: Wilba Mutz MRN: Barbaraann Boys Date of Birth: [birth year]

## 2019-05-17 ENCOUNTER — Ambulatory Visit: Payer: BC Managed Care – PPO | Attending: Family Medicine | Admitting: Physical Therapy

## 2019-05-17 ENCOUNTER — Encounter: Payer: Self-pay | Admitting: Physical Therapy

## 2019-05-17 ENCOUNTER — Other Ambulatory Visit: Payer: Self-pay

## 2019-05-17 DIAGNOSIS — M25512 Pain in left shoulder: Secondary | ICD-10-CM | POA: Diagnosis not present

## 2019-05-17 DIAGNOSIS — M6281 Muscle weakness (generalized): Secondary | ICD-10-CM | POA: Diagnosis not present

## 2019-05-17 DIAGNOSIS — M546 Pain in thoracic spine: Secondary | ICD-10-CM | POA: Insufficient documentation

## 2019-05-17 NOTE — Therapy (Signed)
[facility] [address] [place], [place], [address] Phone: [phone]   Fax:  [phone]  Physical Therapy Treatment  Patient Details  Name: Carolyn Morris MRN: [id number] Date of Birth: 04-26-1991 Referring Provider (PT): Thomasene Lot, DO   Encounter Date: 05/17/2019  PT End of Session - 05/17/19 0947    Visit Number  7    Number of Visits  8    Date for PT Re-Evaluation  06/03/19    PT Start Time  0931    PT Stop Time  1010    PT Time Calculation (min)  39 min       History reviewed. No pertinent past medical history.  History reviewed. No pertinent surgical history.  There were no vitals filed for this visit.  Subjective Assessment - 05/17/19 0944    Subjective  Pt reports pain increased at medial border of scapula. Upper trap area is better post needling 2 sessions ago.    Currently in Pain?  Yes    Pain Score  4     Pain Location  Back    Pain Orientation  Left;Mid    Pain Descriptors / Indicators  Aching;Sore    Aggravating Factors   standing, work duties    Pain Relieving Factors  leaning over, seated breaks at work                       [facility] Adult PT Treatment/Exercise - 05/17/19 0001      Shoulder Exercises: Standing   Horizontal ABduction  15 reps    Theraband Level (Shoulder Horizontal ABduction)  Level 2 (Red)    Horizontal ABduction Limitations  eccentric control     External Rotation  15 reps    Theraband Level (Shoulder External Rotation)  Level 2 (Red)    External Rotation Limitations  cues for scap retract     Row  20 reps    Theraband Level (Shoulder Row)  Level 2 (Red)    Other Standing Exercises  door way stretch     Other Standing Exercises  lower trap activation with bilat flexion wall slides       Shoulder Exercises: ROM/Strengthening   UBE (Upper Arm Bike)  Level 1 Retro x 1 min, forward x 1 minute       Shoulder Exercises: Stretch   Cross Chest Stretch  3  reps;30 seconds    Other Shoulder Stretches  wall angels x 10      Manual Therapy   Manual therapy comments  skilled palpation and monitoring during TPDN    Soft tissue mobilization  LT periscap/rhomboid myofascial release        Trigger Point Dry Needling - 05/17/19 0001    Consent Given?  Yes    Education Handout Provided  Previously provided    Muscles Treated Upper Quadrant  Rhomboids;Infraspinatus    Rhomboids Response  Twitch response elicited;Palpable increased muscle length    Infraspinatus Response  Twitch response elicited;Palpable increased muscle length                PT Long Term Goals - 05/10/19 0932      PT LONG TERM GOAL #1   Title  Pt will be independent in her HEP and progression.    Baseline  issued initial HEP on 04/03/2019    Period  Weeks    Status  On-going      PT LONG TERM GOAL #2   Title  Pt  will improve her FOTO to </= 26 % limitation    Baseline  42 % limitation on 04/03/2019    Status  New      PT LONG TERM GOAL #3   Title  Pt will report pain of </= 3/10 during work after full shift.    Baseline  currently pt reporting pain that increases to 8/10 at times    Time  4    Period  Weeks    Status  On-going      PT LONG TERM GOAL #4   Title  Pt will improve her L UE strength to 5/5 in order to improve her functioning.    Baseline  4/5 grossly    Period  Weeks    Status  On-going      PT LONG TERM GOAL #5   Title  Pt will be able to lift 8 pounds over head and place on shelf with no pain reported using proper body mechanics.    Baseline  difficulty lifting/reaching at work witih pain increasing to 8/10 at times    Time  4    Period  Weeks    Status  On-going            Plan - 05/17/19 1013    Clinical Impression Statement  Pt arrives reporting decreased upper trap pain however continued medial scap border pain. Herbie Saxon, PT performed TPDN and manual soft tissue work to rhomboid and supraspinatus. Continued with scapular  activation and stretching following TPDN and manul. Pt reported no pain at end of session and declined moist heat.    PT Next Visit Plan  DN PRN, thoracic mobility, core strength, FOTO, check gaols    PT Home Exercise Plan  Rows ( red t-band), self mobs with tennis ball, supine scap stab series green band, thoracic extension with foam roller or chair, open books, doorway stretch       Patient will benefit from skilled therapeutic intervention in order to improve the following deficits and impairments:  Pain, Postural dysfunction, Decreased strength, Impaired UE functional use, Decreased range of motion  Visit Diagnosis: Acute left-sided thoracic back pain  Muscle weakness (generalized)  Acute pain of left shoulder     Problem List Patient Active Problem List   Diagnosis Date Noted  . Bronchitis 07/26/2018  . Low energy 01/19/2017  . Adjustment disorder with depressed mood 01/19/2017  . Sleep difficulties 01/19/2017  . Obesity, Class III, BMI 40-49.9 (morbid obesity) ([place]) 05/12/2016  . Counseling on health promotion and disease prevention 05/12/2016  . Environmental and seasonal allergies 04/13/2016  . Ringworm of body 04/13/2016  . Vitamin D deficiency 04/11/2016  . Childhood asthma 09/10/2014    Dorene Ar, PTA 05/17/2019, 10:24 AM  [facility] [address] [place], [place], [address] Phone: [phone]   Fax:  [phone]  Name: Ryah Cribb MRN: [id number] Date of Birth: 11-04-1990

## 2019-05-19 ENCOUNTER — Ambulatory Visit: Payer: BC Managed Care – PPO | Admitting: Physical Therapy

## 2019-05-24 ENCOUNTER — Ambulatory Visit: Payer: BC Managed Care – PPO | Admitting: Physical Therapy

## 2019-05-24 ENCOUNTER — Encounter: Payer: Self-pay | Admitting: Physical Therapy

## 2019-05-24 ENCOUNTER — Other Ambulatory Visit: Payer: Self-pay

## 2019-05-24 DIAGNOSIS — M25512 Pain in left shoulder: Secondary | ICD-10-CM

## 2019-05-24 DIAGNOSIS — M546 Pain in thoracic spine: Secondary | ICD-10-CM | POA: Diagnosis not present

## 2019-05-24 DIAGNOSIS — M6281 Muscle weakness (generalized): Secondary | ICD-10-CM

## 2019-05-24 NOTE — Therapy (Signed)
[facility] Outpatient Rehabilitation [facility] [address] [place], [place], [address] Phone: [phone]   Fax:  [phone]  Physical Therapy Treatment  Patient Details  Name: Carolyn Morris MRN: [id number] Date of Birth: 02-11-1991 Referring Provider (PT): Thomasene Lot, DO   Encounter Date: 05/24/2019  PT End of Session - 05/24/19 1553    Visit Number  8    Number of Visits  11    Date for PT Re-Evaluation  06/03/19    PT Start Time  1550    PT Stop Time  1625    PT Time Calculation (min)  35 min    Activity Tolerance  Patient tolerated treatment well    Behavior During Therapy  [facility] for tasks assessed/performed       History reviewed. No pertinent past medical history.  History reviewed. No pertinent surgical history.  There were no vitals filed for this visit.  Subjective Assessment - 05/24/19 1601    Subjective  No more pain in upper traps, feels itchy/bruised at medial border of Lt scapula.    Patient Stated Goals  Work without pain    Currently in Pain?  Yes    Pain Score  3     Pain Location  Scapula    Pain Orientation  Left    Pain Descriptors / Indicators  Sore                       [facility] Adult PT Treatment/Exercise - 05/24/19 0001      Manual Therapy   Manual therapy comments  skilled palpation and monitoring during TPDN    Soft tissue mobilization  subscapularis       Trigger Point Dry Needling - 05/24/19 0001    Other Dry Needling  Lt rhomboid    Subscapularis Response  Twitch response elicited;Palpable increased muscle length   from anterior and posteriorly          PT Education - 05/24/19 1629    Education Details  plan of care, outcome from DN    Person(s) Educated  Patient    Methods  Explanation    Comprehension  Verbalized understanding          PT Long Term Goals - 05/10/19 0932      PT LONG TERM GOAL #1   Title  Pt will be independent in her HEP and progression.    Baseline   issued initial HEP on 04/03/2019    Period  Weeks    Status  On-going      PT LONG TERM GOAL #2   Title  Pt will improve her FOTO to </= 26 % limitation    Baseline  42 % limitation on 04/03/2019    Status  New      PT LONG TERM GOAL #3   Title  Pt will report pain of </= 3/10 during work after full shift.    Baseline  currently pt reporting pain that increases to 8/10 at times    Time  4    Period  Weeks    Status  On-going      PT LONG TERM GOAL #4   Title  Pt will improve her L UE strength to 5/5 in order to improve her functioning.    Baseline  4/5 grossly    Period  Weeks    Status  On-going      PT LONG TERM GOAL #5   Title  Pt will be able to  lift 8 pounds over head and place on shelf with no pain reported using proper body mechanics.    Baseline  difficulty lifting/reaching at work witih pain increasing to 8/10 at times    Time  4    Period  Weeks    Status  On-going            Plan - 05/24/19 1951    Clinical Impression Statement  Decreased concordant pain following subscap DN, most trigger points noted from axillary region. Will add more postural strengthening activities to utilize while at work which is her most aggrivating factor.    PT Treatment/Interventions  Cryotherapy;Electrical Stimulation;Iontophoresis 4mg /ml Dexamethasone;Moist Heat;Ultrasound;Functional mobility training;Therapeutic activities;Therapeutic exercise;Neuromuscular re-education;Patient/family education;Manual techniques;Passive range of motion;Dry needling;Taping    PT Next Visit Plan  continue DN, postural strength    PT Home Exercise Plan  Rows ( red t-band), self mobs with tennis ball, supine scap stab series green band, thoracic extension with foam roller or chair, open books, doorway stretch    Consulted and Agree with Plan of Care  Patient       Patient will benefit from skilled therapeutic intervention in order to improve the following deficits and impairments:  Pain, Postural  dysfunction, Decreased strength, Impaired UE functional use, Decreased range of motion  Visit Diagnosis: Acute left-sided thoracic back pain  Muscle weakness (generalized)  Acute pain of left shoulder     Problem List Patient Active Problem List   Diagnosis Date Noted  . Bronchitis 07/26/2018  . Low energy 01/19/2017  . Adjustment disorder with depressed mood 01/19/2017  . Sleep difficulties 01/19/2017  . Obesity, Class III, BMI 40-49.9 (morbid obesity) ([place]) 05/12/2016  . Counseling on health promotion and disease prevention 05/12/2016  . Environmental and seasonal allergies 04/13/2016  . Ringworm of body 04/13/2016  . Vitamin D deficiency 04/11/2016  . Childhood asthma 09/10/2014   Izza Bickle C. Kydan Shanholtzer PT, DPT 05/24/19 7:59 PM   [place], [place], [address] Phone: [phone]   Fax:  [phone]  Name: Amberle Lyter MRN: [id number] Date of Birth: 05/04/1991

## 2019-05-26 ENCOUNTER — Other Ambulatory Visit: Payer: Self-pay

## 2019-05-26 ENCOUNTER — Ambulatory Visit: Payer: BC Managed Care – PPO | Admitting: Physical Therapy

## 2019-05-26 ENCOUNTER — Encounter: Payer: Self-pay | Admitting: Physical Therapy

## 2019-05-26 DIAGNOSIS — M546 Pain in thoracic spine: Secondary | ICD-10-CM | POA: Diagnosis not present

## 2019-05-26 DIAGNOSIS — M6281 Muscle weakness (generalized): Secondary | ICD-10-CM

## 2019-05-26 DIAGNOSIS — M25512 Pain in left shoulder: Secondary | ICD-10-CM

## 2019-05-26 NOTE — Therapy (Signed)
[facility] Outpatient Rehabilitation [facility] [address] [place], [place], [address] Phone: [phone]   Fax:  [phone]  Physical Therapy Treatment  Patient Details  Name: Carolyn Morris MRN: [id number] Date of Birth: [birth year] Referring Provider (PT): Thomasene Lot, DO   Encounter Date: 05/26/2019  PT End of Session - 05/26/19 1233    Visit Number  9    Number of Visits  11    Date for PT Re-Evaluation  06/03/19    PT Start Time  1231    PT Stop Time  1309    PT Time Calculation (min)  38 min    Activity Tolerance  Patient tolerated treatment well    Behavior During Therapy  [facility] for tasks assessed/performed       History reviewed. No pertinent past medical history.  History reviewed. No pertinent surgical history.  There were no vitals filed for this visit.  Subjective Assessment - 05/26/19 1232    Subjective  Was able to work yesterday without N/T into arms.    Currently in Pain?  No/denies                       [facility] Adult PT Treatment/Exercise - 05/26/19 0001      Shoulder Exercises: Supine   Other Supine Exercises  core engagement      Shoulder Exercises: Standing   External Rotation  Both;20 reps    Theraband Level (Shoulder External Rotation)  Level 2 (Red)    External Rotation Limitations  single arm, anchored resistance    Extension  Both;20 reps    Theraband Level (Shoulder Extension)  Level 3 (Green)    Row  Both;20 reps    Theraband Level (Shoulder Row)  Level 4 (Blue)    Other Standing Exercises  door way stretch     Other Standing Exercises  GHJ flx liftoff from wall for low trap activation      Manual Therapy   Joint Mobilization  prone rib ER and thoracic PA                  PT Long Term Goals - 05/10/19 0932      PT LONG TERM GOAL #1   Title  Pt will be independent in her HEP and progression.    Baseline  issued initial HEP on 04/03/2019    Period  Weeks    Status  On-going       PT LONG TERM GOAL #2   Title  Pt will improve her FOTO to </= 26 % limitation    Baseline  42 % limitation on 04/03/2019    Status  New      PT LONG TERM GOAL #3   Title  Pt will report pain of </= 3/10 during work after full shift.    Baseline  currently pt reporting pain that increases to 8/10 at times    Time  4    Period  Weeks    Status  On-going      PT LONG TERM GOAL #4   Title  Pt will improve her L UE strength to 5/5 in order to improve her functioning.    Baseline  4/5 grossly    Period  Weeks    Status  On-going      PT LONG TERM GOAL #5   Title  Pt will be able to lift 8 pounds over head and place on shelf with no pain reported using proper body  mechanics.    Baseline  difficulty lifting/reaching at work witih pain increasing to 8/10 at times    Time  4    Period  Weeks    Status  On-going            Plan - 05/26/19 1311    Clinical Impression Statement  Good tolerance to exercise without need for DN today. Heavy cues required for core engagement today and she will continue to practice.    PT Treatment/Interventions  Cryotherapy;Electrical Stimulation;Iontophoresis 4mg /ml Dexamethasone;Moist Heat;Ultrasound;Functional mobility training;Therapeutic activities;Therapeutic exercise;Neuromuscular re-education;Patient/family education;Manual techniques;Passive range of motion;Dry needling;Taping    PT Next Visit Plan  progress postural strengthening    PT Home Exercise Plan  Rows ( red t-band), self mobs with tennis ball, supine scap stab series green band, thoracic extension with foam roller or chair, open books, doorway stretch    Consulted and Agree with Plan of Care  Patient       Patient will benefit from skilled therapeutic intervention in order to improve the following deficits and impairments:  Pain, Postural dysfunction, Decreased strength, Impaired UE functional use, Decreased range of motion  Visit Diagnosis: Acute left-sided thoracic back  pain  Muscle weakness (generalized)  Acute pain of left shoulder     Problem List Patient Active Problem List   Diagnosis Date Noted  . Bronchitis 07/26/2018  . Low energy 01/19/2017  . Adjustment disorder with depressed mood 01/19/2017  . Sleep difficulties 01/19/2017  . Obesity, Class III, BMI 40-49.9 (morbid obesity) ([place]) 05/12/2016  . Counseling on health promotion and disease prevention 05/12/2016  . Environmental and seasonal allergies 04/13/2016  . Ringworm of body 04/13/2016  . Vitamin D deficiency 04/11/2016  . Childhood asthma 09/10/2014   Nicholette Dolson C. Emmer Lillibridge PT, DPT 05/26/19 1:13 PM   [facility] Outpatient Rehabilitation [facility] [address] [place], [place], [address] Phone: [phone]   Fax:  [phone]  Name: Carolyn Morris MRN: [id number] Date of Birth: 05-18-1991

## 2019-05-30 ENCOUNTER — Ambulatory Visit: Payer: BC Managed Care – PPO | Admitting: Physical Therapy

## 2019-05-30 ENCOUNTER — Encounter: Payer: Self-pay | Admitting: Physical Therapy

## 2019-05-30 ENCOUNTER — Other Ambulatory Visit: Payer: Self-pay

## 2019-05-30 DIAGNOSIS — M6281 Muscle weakness (generalized): Secondary | ICD-10-CM

## 2019-05-30 DIAGNOSIS — M546 Pain in thoracic spine: Secondary | ICD-10-CM

## 2019-05-30 DIAGNOSIS — M25512 Pain in left shoulder: Secondary | ICD-10-CM

## 2019-05-30 NOTE — Therapy (Signed)
[facility] Outpatient Rehabilitation [facility] [address] [place], [place], [address] Phone: [phone]   Fax:  [phone]  Physical Therapy Treatment  Patient Details  Name: Carolyn Morris MRN: [id number] Date of Birth: 1991-06-19 Referring Provider (PT): Thomasene Lot, DO   Encounter Date: 05/30/2019  PT End of Session - 05/30/19 1551    Visit Number  10    Number of Visits  11    Date for PT Re-Evaluation  06/03/19    PT Start Time  1547    PT Stop Time  1618    PT Time Calculation (min)  31 min    Activity Tolerance  Patient tolerated treatment well    Behavior During Therapy  [facility] for tasks assessed/performed       History reviewed. No pertinent past medical history.  History reviewed. No pertinent surgical history.  There were no vitals filed for this visit.  Subjective Assessment - 05/30/19 1550    Subjective  It feels a little "itchy"                       [facility] Adult PT Treatment/Exercise - 05/30/19 0001      Shoulder Exercises: ROM/Strengthening   Other ROM/Strengthening Exercises  FM: rows, upright rows, pull down from OH, push ups on bar      Shoulder Exercises: Stretch   Other Shoulder Stretches  pull off bar stretch    Other Shoulder Stretches  o      Manual Therapy   Manual therapy comments  skilled palpation and monitoring during TPDN    Joint Mobilization  prone Lt rib ER and thoracic PA    Soft tissue mobilization  Lt rhomboid, levator scapula       Trigger Point Dry Needling - 05/30/19 0001    Muscles Treated Head and Neck  Levator scapulae    Levator Scapulae Response  Twitch response elicited;Palpable increased muscle length                PT Long Term Goals - 05/10/19 0932      PT LONG TERM GOAL #1   Title  Pt will be independent in her HEP and progression.    Baseline  issued initial HEP on 04/03/2019    Period  Weeks    Status  On-going      PT LONG TERM GOAL #2   Title  Pt  will improve her FOTO to </= 26 % limitation    Baseline  42 % limitation on 04/03/2019    Status  New      PT LONG TERM GOAL #3   Title  Pt will report pain of </= 3/10 during work after full shift.    Baseline  currently pt reporting pain that increases to 8/10 at times    Time  4    Period  Weeks    Status  On-going      PT LONG TERM GOAL #4   Title  Pt will improve her L UE strength to 5/5 in order to improve her functioning.    Baseline  4/5 grossly    Period  Weeks    Status  On-going      PT LONG TERM GOAL #5   Title  Pt will be able to lift 8 pounds over head and place on shelf with no pain reported using proper body mechanics.    Baseline  difficulty lifting/reaching at work witih pain increasing to 8/10 at times  Time  4    Period  Weeks    Status  On-going            Plan - 05/30/19 1620    Clinical Impression Statement  Used free motion machine to review mechanics in pushing, pulling and lifting that she will try to incoorporate at work.    PT Treatment/Interventions  Cryotherapy;Electrical Stimulation;Iontophoresis 4mg /ml Dexamethasone;Moist Heat;Ultrasound;Functional mobility training;Therapeutic activities;Therapeutic exercise;Neuromuscular re-education;Patient/family education;Manual techniques;Passive range of motion;Dry needling;Taping    PT Next Visit Plan  d/c    PT Home Exercise Plan  Rows ( red t-band), self mobs with tennis ball, supine scap stab series green band, thoracic extension with foam roller or chair, open books, doorway stretch    Consulted and Agree with Plan of Care  Patient       Patient will benefit from skilled therapeutic intervention in order to improve the following deficits and impairments:  Pain, Postural dysfunction, Decreased strength, Impaired UE functional use, Decreased range of motion  Visit Diagnosis: Acute left-sided thoracic back pain  Muscle weakness (generalized)  Acute pain of left shoulder     Problem  List Patient Active Problem List   Diagnosis Date Noted  . Bronchitis 07/26/2018  . Low energy 01/19/2017  . Adjustment disorder with depressed mood 01/19/2017  . Sleep difficulties 01/19/2017  . Obesity, Class III, BMI 40-49.9 (morbid obesity) ([place]) 05/12/2016  . Counseling on health promotion and disease prevention 05/12/2016  . Environmental and seasonal allergies 04/13/2016  . Ringworm of body 04/13/2016  . Vitamin D deficiency 04/11/2016  . Childhood asthma 09/10/2014    Miquela Costabile C. Huy Majid PT, DPT 05/30/19 4:21 PM   [place], [place], [address] Phone: [phone]   Fax:  [phone]  Name: Carolyn Morris MRN: [id number] Date of Birth: 05-14-1991

## 2019-06-01 ENCOUNTER — Other Ambulatory Visit: Payer: Self-pay

## 2019-06-01 ENCOUNTER — Ambulatory Visit: Payer: BC Managed Care – PPO | Admitting: Physical Therapy

## 2019-06-01 ENCOUNTER — Encounter: Payer: Self-pay | Admitting: Physical Therapy

## 2019-06-01 DIAGNOSIS — M25512 Pain in left shoulder: Secondary | ICD-10-CM

## 2019-06-01 DIAGNOSIS — M546 Pain in thoracic spine: Secondary | ICD-10-CM | POA: Diagnosis not present

## 2019-06-01 DIAGNOSIS — M6281 Muscle weakness (generalized): Secondary | ICD-10-CM

## 2019-06-01 NOTE — Therapy (Signed)
[place], [place], [address] Phone: [phone]   Fax:  [phone]  Physical Therapy Treatment/Discharge  Patient Details  Name: Carolyn Morris MRN: [id number] Date of Birth: [birth year] Referring Provider (PT): Mellody Dance, DO   Encounter Date: 06/01/2019  PT End of Session - 06/01/19 1154    Visit Number  11    Number of Visits  11    Date for PT Re-Evaluation  06/03/19    PT Start Time  1146    PT Stop Time  1209    PT Time Calculation (min)  23 min    Activity Tolerance  Patient tolerated treatment well    Behavior During Therapy  [facility] for tasks assessed/performed       History reviewed. No pertinent past medical history.  History reviewed. No pertinent surgical history.  There were no vitals filed for this visit.  Subjective Assessment - 06/01/19 1150    Subjective  Still gets a little tingling every now and then.    Currently in Pain?  No/denies         [facility] PT Assessment - 06/01/19 0001      Assessment   Medical Diagnosis  left thoracic pain,  left shoulder pain    Referring Provider (PT)  Mellody Dance, DO      Prior Function   Vocation  Full time employment    [employer], reaching and lifting in [employer] all day on her feet      Observation/Other Assessments   Focus on Therapeutic Outcomes (FOTO)   24% limited      Strength   Overall Strength Comments  gross GHJ 5/5 without pain      Palpation   Palpation comment  TTP noted just medial to Lt scapula at trigger point, otherwise prior areas of tenderness resolve                   [facility] Adult PT Treatment/Exercise - 06/01/19 0001      Manual Therapy   Soft tissue mobilization  LT periscapular region             PT Education - 06/01/19 1220    Education Details  goals, FOTO, importance of continued HEP    Person(s) Educated  Patient    Methods  Explanation    Comprehension  Verbalized understanding          PT Long Term Goals - 06/01/19 1151      PT LONG TERM GOAL #1   Title  Pt will be independent in her HEP and progression.    Status  Achieved      PT LONG TERM GOAL #2   Title  Pt will improve her FOTO to </= 26 % limitation    Baseline  24%    Status  Achieved      PT LONG TERM GOAL #3   Title  Pt will report pain of </= 3/10 during work after full shift.    Baseline  up to 5 or 6, its just there and super annoying    Status  Not Met      PT LONG TERM GOAL #4   Title  Pt will improve her L UE strength to 5/5 in order to improve her functioning.    Status  Achieved      PT LONG TERM GOAL #5   Title  Pt will be able to lift 8 pounds over head and  place on shelf with no pain reported using proper body mechanics.    Status  Achieved            Plan - 06/01/19 1220    Clinical Impression Statement  Trigger points released manually again today and pt states she is going to go purchase a Back Buddy for self STM. Advised that she will feel ups and downs with returns on trigger points around shoulders, but the idea is that she understands how to control them before they create problems as before. She is d/c to independent program at this time and was encouraged to contact us with any further questions.    PT Treatment/Interventions  Cryotherapy;Electrical Stimulation;Iontophoresis 107m/ml Dexamethasone;Moist Heat;Ultrasound;Functional mobility training;Therapeutic activities;Therapeutic exercise;Neuromuscular re-education;Patient/family education;Manual techniques;Passive range of motion;Dry needling;Taping    PT Home Exercise Plan  Rows ( red t-band), self mobs with tennis ball, supine scap stab series green band, thoracic extension with foam roller or chair, open books, doorway stretch    Consulted and Agree with Plan of Care  Patient       Patient will benefit from skilled therapeutic intervention in order to improve the  following deficits and impairments:  Pain, Postural dysfunction, Decreased strength, Impaired UE functional use, Decreased range of motion  Visit Diagnosis: Acute left-sided thoracic back pain  Muscle weakness (generalized)  Acute pain of left shoulder     Problem List Patient Active Problem List   Diagnosis Date Noted  . Bronchitis 07/26/2018  . Low energy 01/19/2017  . Adjustment disorder with depressed mood 01/19/2017  . Sleep difficulties 01/19/2017  . Obesity, Class III, BMI 40-49.9 (morbid obesity) [place] 05/12/2016  . Counseling on health promotion and disease prevention 05/12/2016  . Environmental and seasonal allergies 04/13/2016  . Ringworm of body 04/13/2016  . Vitamin D deficiency 04/11/2016  . Childhood asthma 09/10/2014   PHYSICAL THERAPY DISCHARGE SUMMARY  Visits from Start of Care: 11  Current functional level related to goals / functional outcomes: See above   Remaining deficits: See above   Education / Equipment: Anatomy of condition, POC, HEP exercise form/rationale  Plan: Patient agrees to discharge.  Patient goals were met. Patient is being discharged due to meeting the stated rehab goals.  ?????    Carolyn Morris PT, DPT 06/01/19 12:23 PM    [facility] [place] [address]Phone: [phone]  Fax:  [phone] Name: SGearl KimbroughMRN: [id number] of Birth: [birth year]

## 2019-07-18 ENCOUNTER — Other Ambulatory Visit: Payer: Self-pay

## 2019-07-18 ENCOUNTER — Encounter: Payer: Self-pay | Admitting: Adult Health

## 2019-07-18 ENCOUNTER — Ambulatory Visit (INDEPENDENT_AMBULATORY_CARE_PROVIDER_SITE_OTHER): Payer: BC Managed Care – PPO | Admitting: Adult Health

## 2019-07-18 VITALS — BP 122/77 | HR 108 | Temp 98.7°F | Ht 62.0 in | Wt 237.9 lb

## 2019-07-18 DIAGNOSIS — L259 Unspecified contact dermatitis, unspecified cause: Secondary | ICD-10-CM | POA: Diagnosis not present

## 2019-07-18 DIAGNOSIS — R21 Rash and other nonspecific skin eruption: Secondary | ICD-10-CM

## 2019-07-18 DIAGNOSIS — Z8619 Personal history of other infectious and parasitic diseases: Secondary | ICD-10-CM | POA: Insufficient documentation

## 2019-07-18 MED ORDER — PREDNISONE 20 MG PO TABS: ORAL_TABLET | ORAL | 0 refills | Status: DC

## 2019-07-18 NOTE — Progress Notes (Signed)
Subjective:    Patient ID: Carolyn Morris, female    DOB: [birth year], 29 y.o.   MRN: [id number]  HPI:  Carolyn Morris presents diffuse pruritis underneath arms, lower abdomen, and in between her thighs. She denies pain/drainage from sites. She denies the following- 1) change in laundry detergent 2) she has not travelled anywhere  3) she has not slept anywhere but home lately 4) she has not started any new medications 5) she has not received any new vaccinations 6) she has not eaten any new foods  She has been donating plasma at [facility] twice weekly since 06/20/2019.  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  Patient Care Team    Relationship Specialty Notifications Start End  Thomasene Lot, DO PCP - General Family Medicine  04/13/16     Patient Active Problem List   Diagnosis Date Noted  . Contact dermatitis 07/18/2019  . 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 Bronchitis 07/26/2018  . Low energy 01/19/2017  . Adjustment disorder with depressed mood 01/19/2017  . Sleep difficulties 01/19/2017  . Obesity, Class III, BMI 40-49.9 (morbid obesity) (HCC) 05/12/2016  . Counseling on health promotion and disease prevention 05/12/2016  . Environmental and seasonal allergies 04/13/2016  . Ringworm of body 04/13/2016  . Vitamin D deficiency 04/11/2016  . 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 No pertinent past medical history.   History reviewed. No pertinent surgical history.   Family History  Problem Relation Age of Onset  . Hyperlipidemia Mother   . Diabetes Mother   . Diabetes Maternal Grandmother   . Cancer Paternal Aunt        skin cancer  . Diabetes Paternal Aunt      Social History    Substance and Sexual Activity  Drug Use No     Social History   Substance and Sexual Activity  Alcohol Use Yes  . Alcohol/week: 0.0 standard drinks     Social History   Tobacco Use  Smoking Status Never Smoker  Smokeless Tobacco Never Used     Outpatient Encounter Medications as of 07/18/2019  Medication Sig  . predniSONE (DELTASONE) 20 MG tablet Take 3 tablets for two days. Take 2 tablets for two days. Take one tablet two days. Take 1/2 tablet for 2 days.  . [DISCONTINUED] cyclobenzaprine (FLEXERIL) 10 MG tablet Take 1 tablet (10 mg total) by mouth 3 (three) times daily as needed for muscle spasms.  . [DISCONTINUED] naproxen (NAPROSYN) 500 MG tablet Take 1 tablet (500 mg total) by mouth 2 (two) times daily with a meal.   No facility-administered encounter medications on file as of 07/18/2019.    Allergies: Patient has no known allergies.  Body mass index is 43.51 kg/m.  Blood pressure 122/77, pulse (!) 108, temperature 98.7 F (37.1 C), temperature source Oral, height 5\' 2"  (1.575 m), weight 237 lb 14.4 oz (107.9 kg), last menstrual period 06/14/2019, SpO2 97 %.  Review of Systems  Constitutional: Positive for fatigue. Negative for activity change, appetite change, chills, diaphoresis, fever and unexpected weight change.  Respiratory: Negative for cough, chest tightness, shortness of breath, wheezing and stridor.   Cardiovascular: Negative for chest pain, palpitations and leg swelling.  Genitourinary:  Negative for genital sores.  Skin: Positive for color change and rash.  Hematological: Negative for adenopathy. Does not bruise/bleed easily.       Objective:   Physical Exam Vitals and nursing note reviewed.  Constitutional:      Appearance: She is obese.  HENT:     Head: Normocephalic and atraumatic.  Eyes:     Extraocular Movements: Extraocular movements intact.     Conjunctiva/sclera: Conjunctivae normal.     Pupils: Pupils are equal, round, and reactive  to light.  Cardiovascular:     Rate and Rhythm: Regular rhythm. Tachycardia present.     Pulses: Normal pulses.     Heart sounds: Normal heart sounds. No murmur. No friction rub. No gallop.   Pulmonary:     Effort: Pulmonary effort is normal. No respiratory distress.     Breath sounds: Normal breath sounds. No stridor. No wheezing, rhonchi or rales.  Chest:     Chest wall: No tenderness.  Skin:    General: Skin is warm.     Capillary Refill: Capillary refill takes less than 2 seconds.     Findings: Erythema and rash present. Rash is urticarial.          Comments: No open tissue/streaking/drainage noted   Neurological:     Mental Status: She is alert and oriented to person, place, and time.  Psychiatric:        Mood and Affect: Mood normal.        Behavior: Behavior normal.        Thought Content: Thought content normal.        Judgment: Judgment normal.           Assessment & Plan:   1. Rash   2. Contact dermatitis, unspecified contact dermatitis type, unspecified trigger   3. 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 Please use Prednisone taper as ordered. Remain well hydrated. Continue to social distance and wear a mask when in public. For oral cold sores- recommend OTC Abreva.  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    FOLLOW-UP:  Return if symptoms worsen or fail to improve.

## 2019-07-18 NOTE — Assessment & Plan Note (Signed)
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

## 2019-07-18 NOTE — Assessment & Plan Note (Signed)
Avoid tight fitting clothing. Please use Prednisone taper as ordered. Remain well hydrated. Continue to social distance and wear a mask when in public. For oral cold sores- recommend OTC Abreva.

## 2019-07-18 NOTE — Patient Instructions (Signed)
Contact Dermatitis Dermatitis is redness, soreness, and swelling (inflammation) of the skin. Contact dermatitis is a reaction to something that touches the skin. There are two types of contact dermatitis:  Irritant contact dermatitis. This happens when something bothers (irritates) your skin, like soap.  Allergic contact dermatitis. This is caused when you are exposed to something that you are allergic to, such as poison ivy. What are the causes?  Common causes of irritant contact dermatitis include: ? Makeup. ? Soaps. ? Detergents. ? Bleaches. ? Acids. ? Metals, such as nickel.  Common causes of allergic contact dermatitis include: ? Plants. ? Chemicals. ? Jewelry. ? Latex. ? Medicines. ? Preservatives in products, such as clothing. What increases the risk?  Having a job that exposes you to things that bother your skin.  Having asthma or eczema. What are the signs or symptoms? Symptoms may happen anywhere the irritant has touched your skin. Symptoms include:  Dry or flaky skin.  Redness.  Cracks.  Itching.  Pain or a burning feeling.  Blisters.  Blood or clear fluid draining from skin cracks. With allergic contact dermatitis, swelling may occur. This may happen in places such as the eyelids, mouth, or genitals. How is this treated?  This condition is treated by checking for the cause of the reaction and protecting your skin. Treatment may also include: ? Steroid creams, ointments, or medicines. ? Antibiotic medicines or other ointments, if you have a skin infection. ? Lotion or medicines to help with itching. ? A bandage (dressing). Follow these instructions at home: Skin care  Moisturize your skin as needed.  Put cool cloths on your skin.  Put a baking soda paste on your skin. Stir water into baking soda until it looks like a paste.  Do not scratch your skin.  Avoid having things rub up against your skin.  Avoid the use of soaps, perfumes, and  dyes. Medicines  Take or apply over-the-counter and prescription medicines only as told by your doctor.  If you were prescribed an antibiotic medicine, take or apply it as told by your doctor. Do not stop using it even if your condition starts to get better. Bathing  Take a bath with: ? Epsom salts. ? Baking soda. ? Colloidal oatmeal.  Bathe less often.  Bathe in warm water. Avoid using hot water. Bandage care  If you were given a bandage, change it as told by your health care provider.  Wash your hands with soap and water before and after you change your bandage. If soap and water are not available, use hand sanitizer. General instructions  Avoid the things that caused your reaction. If you do not know what caused it, keep a journal. Write down: ? What you eat. ? What skin products you use. ? What you drink. ? What you wear in the area that has symptoms. This includes jewelry.  Check the affected areas every day for signs of infection. Check for: ? More redness, swelling, or pain. ? More fluid or blood. ? Warmth. ? Pus or a bad smell.  Keep all follow-up visits as told by your doctor. This is important. Contact a doctor if:  You do not get better with treatment.  Your condition gets worse.  You have signs of infection, such as: ? More swelling. ? Tenderness. ? More redness. ? Soreness. ? Warmth.  You have a fever.  You have new symptoms. Get help right away if:  You have a very bad headache.  You have neck pain.  Your neck is stiff.  You throw up (vomit).  You feel very sleepy.  You see red streaks coming from the area.  Your bone or joint near the area hurts after the skin has healed.  The area turns darker.  You have trouble breathing. Summary  Dermatitis is redness, soreness, and swelling of the skin.  Symptoms may occur where the irritant has touched you.  Treatment may include medicines and skin care.  If you do not know what caused  your reaction, keep a journal.  Contact a doctor if your condition gets worse or you have signs of infection. This information is not intended to replace advice given to you by your health care provider. Make sure you discuss any questions you have with your health care provider. Document Revised: 10/19/2018 Document Reviewed: 01/12/2018 Elsevier Patient Education  2020 [employer].  Avoid tight fitting clothing. Please use Prednisone taper as ordered. Remain well hydrated. Continue to social distance and wear a mask when in public. For oral cold sores- recommend OTC Abreva. FEEL BETTER!

## 2019-07-20 ENCOUNTER — Telehealth: Payer: Self-pay | Admitting: Family Medicine

## 2019-07-20 LAB — HSV(HERPES SMPLX)ABS-I+II(IGG+IGM)-BLD
HSV 1 Glycoprotein G Ab, IgG: <0.91 index (ref 0.00–0.90)
HSV 2 IgG, Type Spec: <0.91 index (ref 0.00–0.90)
HSVI/II Comb IgM: <0.91 Ratio (ref 0.00–0.90)

## 2019-07-20 NOTE — Telephone Encounter (Signed)
Good Afternoon [name], Can you please cal [name] and share- If her rash is contact dermatitis or heat rash- prednisone taper will help with both- please complete course. Thanks! Orpha Bur

## 2019-07-20 NOTE — Telephone Encounter (Signed)
Please advise.  T. Eira Alpert, CMA 

## 2019-07-20 NOTE — Telephone Encounter (Signed)
Pt informed.  Pt expressed understanding and is agreeable.  T. Jaquayla Hege, CMA  

## 2019-07-20 NOTE — Telephone Encounter (Signed)
Dr. Synthia Innocent Patient called states was seen earlier this week by Orpha Bur) for a Rash, says thinks now it is a ( heat rash) & wonders if she needs to continue taking prednisone.  --Forwarding message to medical asst to contact patient w/ Advice @ [phone].  --glh

## 2019-09-29 ENCOUNTER — Ambulatory Visit: Payer: BC Managed Care – PPO | Attending: Internal Medicine

## 2019-09-29 DIAGNOSIS — Z23 Encounter for immunization: Secondary | ICD-10-CM

## 2019-09-29 NOTE — Progress Notes (Signed)
   Covid-19 Vaccination Clinic  Name:  Carolyn Morris    MRN: [id number] DOB: Nov 11, 1990  09/29/2019  [name] was observed post Covid-19 immunization for 15 minutes without incident. She was provided with Vaccine Information Sheet and instruction to access the V-Safe system.   [name] was instructed to call 911 with any severe reactions post vaccine: Marland Kitchen Difficulty breathing  . Swelling of face and throat  . A fast heartbeat  . A bad rash all over body  . Dizziness and weakness   Immunizations Administered    Name Date Dose VIS Date Route   Pfizer COVID-19 Vaccine 09/29/2019  2:16 PM 0.3 mL 06/23/2019 Intramuscular   Manufacturer: [employer], [employer]   Lot: [id number]   NDC: [address]-5

## 2019-10-24 ENCOUNTER — Ambulatory Visit: Payer: BC Managed Care – PPO | Attending: Internal Medicine

## 2019-10-24 DIAGNOSIS — Z23 Encounter for immunization: Secondary | ICD-10-CM

## 2019-10-24 NOTE — Progress Notes (Signed)
   Covid-19 Vaccination Clinic  Name:  Carolyn Morris    MRN: [id number] DOB: 1991/04/01  10/24/2019  Ms. Carolyn Morris was observed post Covid-19 immunization for 15 minutes without incident. She was provided with Vaccine Information Sheet and instruction to access the V-Safe system.   Ms. Carolyn Morris was instructed to call 911 with any severe reactions post vaccine: Marland Kitchen Difficulty breathing  . Swelling of face and throat  . A fast heartbeat  . A bad rash all over body  . Dizziness and weakness   Immunizations Administered    Name Date Dose VIS Date Route   Pfizer COVID-19 Vaccine 10/24/2019  3:14 PM 0.3 mL 06/23/2019 Intramuscular   Manufacturer: [employer], [employer]   Lot: [id number]   NDC: [address]-4

## 2020-01-23 ENCOUNTER — Ambulatory Visit (INDEPENDENT_AMBULATORY_CARE_PROVIDER_SITE_OTHER): Payer: BC Managed Care – PPO | Admitting: Physician Assistant

## 2020-01-23 ENCOUNTER — Encounter: Payer: Self-pay | Admitting: Physician Assistant

## 2020-01-23 ENCOUNTER — Other Ambulatory Visit: Payer: Self-pay

## 2020-01-23 VITALS — BP 101/70 | HR 67 | Temp 98.1°F | Ht 62.0 in | Wt 241.7 lb

## 2020-01-23 DIAGNOSIS — M79672 Pain in left foot: Secondary | ICD-10-CM | POA: Diagnosis not present

## 2020-01-23 DIAGNOSIS — R600 Localized edema: Secondary | ICD-10-CM

## 2020-01-23 MED ORDER — NAPROXEN 500 MG PO TABS: 500.0000 mg | ORAL_TABLET | Freq: Two times a day (BID) | ORAL | 0 refills | Status: DC

## 2020-01-23 NOTE — Patient Instructions (Signed)

## 2020-01-23 NOTE — Progress Notes (Signed)
Acute Office Visit  Subjective:    Patient ID: Carolyn Morris, female    DOB: 1990/09/26, 29 y.o.   MRN: [id number]  Chief Complaint  Patient presents with  . Foot Swelling    HPI Patient is in today for bilateral foot swelling for about a month, more prominent on left foot.  States she also has left heel pain.  Denies right foot pain. Reports being on her feet for long periods of time flares up her swelling.  She has tried elevation and ice with minimal relief. Denies previous episodes of edema, recent injury/trauma, chest pain, shortness of breath, or history of gout or other immune conditions. Sometimes does have burning sensation.  History reviewed. No pertinent past medical history.  History reviewed. No pertinent surgical history.  Family History  Problem Relation Age of Onset  . Hyperlipidemia Mother   . Diabetes Mother   . Diabetes Maternal Grandmother   . Cancer Paternal Aunt        skin cancer  . Diabetes Paternal Aunt     Social History   Socioeconomic History  . Marital status: Single    Spouse name: Not on file  . Number of children: Not on file  . Years of education: Not on file  . Highest education level: Not on file  Occupational History  . Occupation: Hospital doctor: [employer]  Tobacco Use  . Smoking status: Never Smoker  . Smokeless tobacco: Never Used  Vaping Use  . Vaping Use: Never used  Substance and Sexual Activity  . Alcohol use: Yes    Alcohol/week: 0.0 standard drinks  . Drug use: No  . Sexual activity: Never  Other Topics Concern  . Not on file  Social History Narrative  . Not on file   Social Determinants of Health   Financial Resource Strain:   . Difficulty of Paying Living Expenses:   Food Insecurity:   . Worried About Charity fundraiser in the Last Year:   . Arboriculturist in the Last Year:   Transportation Needs:   . Film/video editor (Medical):   Marland Kitchen Lack of Transportation (Non-Medical):    Physical Activity:   . Days of Exercise per Week:   . Minutes of Exercise per Session:   Stress:   . Feeling of Stress :   Social Connections:   . Frequency of Communication with Friends and Family:   . Frequency of Social Gatherings with Friends and Family:   . Attends Religious Services:   . Active Member of Clubs or Organizations:   . Attends Archivist Meetings:   Marland Kitchen Marital Status:   Intimate Partner Violence:   . Fear of Current or Ex-Partner:   . Emotionally Abused:   Marland Kitchen Physically Abused:   . Sexually Abused:     Outpatient Medications Prior to Visit  Medication Sig Dispense Refill  . predniSONE (DELTASONE) 20 MG tablet Take 3 tablets for two days. Take 2 tablets for two days. Take one tablet two days. Take 1/2 tablet for 2 days. 13 tablet 0   No facility-administered medications prior to visit.    No Known Allergies  Review of Systems Review of Systems:  A fourteen system review of systems was performed and found to be positive as per HPI.     Objective:    Physical Exam General: Well nourished, in no apparent distress. Eyes: PERRLA, EOMs, conjunctiva clr Resp: Respiratory effort- normal, ECTA B/L w/o W/R/R  Cardio: RRR w/o MRGs. Abdomen: no gross distention. Lymphatics:  less 2 sec cap RF M-sk: Good ROM, good strength, non-pitting edema of LE present w/o erythema or warmth, TTP of left heel Skin: Warm, dry  Neuro: Alert, Oriented Psych: Normal affect, Insight and Judgment appropriate.     BP 101/70   Pulse 67   Temp 98.1 F (36.7 C) (Oral)   Ht 5' 2"  (1.575 m)   Wt 241 lb 11.2 oz (109.6 kg)   LMP 01/18/2020 (Exact Date)   SpO2 100% Comment: on RA  BMI 44.21 kg/m  Wt Readings from Last 3 Encounters:  01/23/20 241 lb 11.2 oz (109.6 kg)  07/18/19 237 lb 14.4 oz (107.9 kg)  01/26/19 239 lb (108.4 kg)    Health Maintenance Due  Topic Date Due  . TETANUS/TDAP  Never done  . PAP SMEAR-Modifier  05/13/2019    There are no preventive  care reminders to display for this patient.   Lab Results  Component Value Date   TSH 3.880 04/18/2018   Lab Results  Component Value Date   WBC 5.8 01/23/2020   HGB 11.9 01/23/2020   HCT 37.9 01/23/2020   MCV 82 01/23/2020   PLT 228 01/23/2020   Lab Results  Component Value Date   NA 139 01/23/2020   K 4.1 01/23/2020   CO2 24 01/23/2020   GLUCOSE 86 01/23/2020   BUN 12 01/23/2020   CREATININE 0.64 01/23/2020   BILITOT 0.4 01/23/2020   ALKPHOS 96 01/23/2020   AST 15 01/23/2020   ALT 10 01/23/2020   PROT 5.5 (L) 01/23/2020   ALBUMIN 3.8 01/23/2020   CALCIUM 8.7 01/23/2020   Lab Results  Component Value Date   CHOL 152 04/18/2018   Lab Results  Component Value Date   HDL 44 04/18/2018   Lab Results  Component Value Date   LDLCALC 91 04/18/2018   Lab Results  Component Value Date   TRIG 86 04/18/2018   Lab Results  Component Value Date   CHOLHDL 3.5 04/18/2018   Lab Results  Component Value Date   HGBA1C 5.0 04/18/2018       Assessment & Plan:   Problem List Items Addressed This Visit    None    Visit Diagnoses    Bilateral lower extremity edema    -  Primary   Relevant Medications   naproxen (NAPROSYN) 500 MG tablet   Other Relevant Orders   CBC (Completed)   Comp Met (CMET) (Completed)   Uric acid (Completed)   Pain of left heel       Relevant Medications   naproxen (NAPROSYN) 500 MG tablet   Other Relevant Orders   Uric acid (Completed)     Bilateral lower extremity edema, pain of left heel: -Placed lab orders to evaluate for possible etiologies such as gout, infection, or kidney disease. -Recommend to continue elevation, use compression socks and good support shoes. -Use Naproxen as needed for pain. -Follow up if symptoms worsen or fail to improve.   Meds ordered this encounter  Medications  . naproxen (NAPROSYN) 500 MG tablet    Sig: Take 1 tablet (500 mg total) by mouth 2 (two) times daily with a meal.    Dispense:  30 tablet     Refill:  0    Order Specific Question:   Supervising Provider    Answer:   Beatrice Lecher D [[id number]]   Note:  This note was prepared with assistance of Dragon voice recognition software. Occasional  wrong-word or sound-a-like substitutions may have occurred due to the inherent limitations of voice recognition software.   Lorrene Reid, PA-C

## 2020-01-24 LAB — CBC
Hematocrit: 37.9 % (ref 34.0–46.6)
Hemoglobin: 11.9 g/dL (ref 11.1–15.9)
MCH: 25.8 pg — ABNORMAL LOW (ref 26.6–33.0)
MCHC: 31.4 g/dL — ABNORMAL LOW (ref 31.5–35.7)
MCV: 82 fL (ref 79–97)
Platelets: 228 10*3/uL (ref 150–450)
RBC: 4.61 x10E6/uL (ref 3.77–5.28)
RDW: 14.1 % (ref 11.7–15.4)
WBC: 5.8 10*3/uL (ref 3.4–10.8)

## 2020-01-24 LAB — COMPREHENSIVE METABOLIC PANEL
ALT: 10 IU/L (ref 0–32)
AST: 15 IU/L (ref 0–40)
Albumin/Globulin Ratio: 2.2 (ref 1.2–2.2)
Albumin: 3.8 g/dL (ref 3.8–4.8)
Alkaline Phosphatase: 96 IU/L (ref 48–121)
BUN/Creatinine Ratio: 19 (ref 9–23)
BUN: 12 mg/dL (ref 6–20)
Bilirubin Total: 0.4 mg/dL (ref 0.0–1.2)
CO2: 24 mmol/L (ref 20–29)
Calcium: 8.7 mg/dL (ref 8.7–10.2)
Chloride: 106 mmol/L (ref 96–106)
Creatinine, Ser: 0.64 mg/dL (ref 0.57–1.00)
GFR calc Af Amer: 138 mL/min/{1.73_m2} (ref >59)
GFR calc non Af Amer: 119 mL/min/{1.73_m2} (ref >59)
Globulin, Total: 1.7 g/dL (ref 1.5–4.5)
Glucose: 86 mg/dL (ref 65–99)
Potassium: 4.1 mmol/L (ref 3.5–5.2)
Sodium: 139 mmol/L (ref 134–144)
Total Protein: 5.5 g/dL — ABNORMAL LOW (ref 6.0–8.5)

## 2020-01-24 LAB — URIC ACID: Uric Acid: 4.8 mg/dL (ref 2.6–6.2)

## 2020-01-30 ENCOUNTER — Other Ambulatory Visit: Payer: Self-pay | Admitting: Podiatry

## 2020-01-30 ENCOUNTER — Other Ambulatory Visit: Payer: Self-pay

## 2020-01-30 ENCOUNTER — Ambulatory Visit (INDEPENDENT_AMBULATORY_CARE_PROVIDER_SITE_OTHER): Payer: BC Managed Care – PPO | Admitting: Podiatry

## 2020-01-30 ENCOUNTER — Ambulatory Visit (INDEPENDENT_AMBULATORY_CARE_PROVIDER_SITE_OTHER): Payer: BC Managed Care – PPO

## 2020-01-30 DIAGNOSIS — M79672 Pain in left foot: Secondary | ICD-10-CM

## 2020-01-30 DIAGNOSIS — M722 Plantar fascial fibromatosis: Secondary | ICD-10-CM | POA: Diagnosis not present

## 2020-02-01 ENCOUNTER — Encounter: Payer: Self-pay | Admitting: Podiatry

## 2020-02-01 NOTE — Progress Notes (Signed)
  Subjective:  Patient ID: Carolyn Morris, female    DOB: 1991/05/07,  MRN: [id number]  Chief Complaint  Patient presents with  . Foot Pain    pt is here for left foot pain    29 y.o. female presents with the above complaint.  Patient presents with left heel pain that has been going on for quite some time.  Patient states is on and off for above couple of months.  Pain is elevated when walking on it there is sharp shooting pain associated with it especially in the morning when taking the first day.  Patient experiences post static dyskinesia type of symptoms.Patient has tried anti-inflammatory but has not helped.  She has not seen anyone else prior to see me for this.  She denies any other acute complaints.  She has not tried any other conservative modalities.   Review of Systems: Negative except as noted in the HPI. Denies N/V/F/Ch.  No past medical history on file.  Current Outpatient Medications:  .  naproxen (NAPROSYN) 500 MG tablet, Take 1 tablet (500 mg total) by mouth 2 (two) times daily with a meal., Disp: 30 tablet, Rfl: 0  Social History   Tobacco Use  Smoking Status Never Smoker  Smokeless Tobacco Never Used    No Known Allergies Objective:  There were no vitals filed for this visit. There is no height or weight on file to calculate BMI. Constitutional Well developed. Well nourished.  Vascular Dorsalis pedis pulses palpable bilaterally. Posterior tibial pulses palpable bilaterally. Capillary refill normal to all digits.  No cyanosis or clubbing noted. Pedal hair growth normal.  Neurologic Normal speech. Oriented to person, place, and time. Epicritic sensation to light touch grossly present bilaterally.  Dermatologic Nails well groomed and normal in appearance. No open wounds. No skin lesions.  Orthopedic: Normal joint ROM without pain or crepitus bilaterally. No visible deformities. Tender to palpation at the calcaneal tuber left. No pain with calcaneal  squeeze left. Ankle ROM full range of motion left. Silfverskiold Test: negative left.   Radiographs: Taken and reviewed. No acute fractures or dislocations. No evidence of stress fracture.  Plantar heel spur absent. Posterior heel spur absent.   Assessment:   1. Foot pain, left   2. Plantar fasciitis of left foot    Plan:  Patient was evaluated and treated and all questions answered.  Plantar Fasciitis, left - XR reviewed as above.  - Educated on icing and stretching. Instructions given.  - Injection delivered to the plantar fascia as below. - DME: Plantar Fascial Brace - Pharmacologic management: None  Procedure: Injection Tendon/Ligament Location: Left plantar fascia at the glabrous junction; medial approach. Skin Prep: alcohol Injectate: 0.5 cc 0.5% marcaine plain, 0.5 cc of 1% Lidocaine, 0.5 cc kenalog 10. Disposition: Patient tolerated procedure well. Injection site dressed with a band-aid.  No follow-ups on file.

## 2020-02-14 DIAGNOSIS — Z3202 Encounter for pregnancy test, result negative: Secondary | ICD-10-CM | POA: Diagnosis not present

## 2020-02-14 DIAGNOSIS — Z30017 Encounter for initial prescription of implantable subdermal contraceptive: Secondary | ICD-10-CM | POA: Diagnosis not present

## 2020-02-27 ENCOUNTER — Other Ambulatory Visit: Payer: Self-pay

## 2020-02-27 ENCOUNTER — Ambulatory Visit (INDEPENDENT_AMBULATORY_CARE_PROVIDER_SITE_OTHER): Payer: BC Managed Care – PPO | Admitting: Podiatry

## 2020-02-27 ENCOUNTER — Encounter: Payer: Self-pay | Admitting: Podiatry

## 2020-02-27 DIAGNOSIS — M79672 Pain in left foot: Secondary | ICD-10-CM | POA: Diagnosis not present

## 2020-02-27 DIAGNOSIS — M722 Plantar fascial fibromatosis: Secondary | ICD-10-CM

## 2020-02-27 NOTE — Progress Notes (Signed)
  Subjective:  Patient ID: Carolyn Morris, female    DOB: March 02, 1991,  MRN: [id number]  Chief Complaint  Patient presents with  . Plantar Fasciitis    "the pain is worse in the mornings and evenings now.  Its not as bad during the work day"    29 y.o. female presents with the above complaint.  Patient presents with a follow-up of left plantar fasciitis.  Patient states she is doing a lot better.  She states that there is still is painful in the morning but not as bad while working.  Patient states that injection helped a little bit.  Denies but has been hurting her a little bit.  She denies any other acute complaints.  She has done some stretching.  She denies any other acute complaints   Review of Systems: Negative except as noted in the HPI. Denies N/V/F/Ch.  No past medical history on file.  Current Outpatient Medications:  .  naproxen (NAPROSYN) 500 MG tablet, Take 1 tablet (500 mg total) by mouth 2 (two) times daily with a meal., Disp: 30 tablet, Rfl: 0  Social History   Tobacco Use  Smoking Status Never Smoker  Smokeless Tobacco Never Used    No Known Allergies Objective:  There were no vitals filed for this visit. There is no height or weight on file to calculate BMI. Constitutional Well developed. Well nourished.  Vascular Dorsalis pedis pulses palpable bilaterally. Posterior tibial pulses palpable bilaterally. Capillary refill normal to all digits.  No cyanosis or clubbing noted. Pedal hair growth normal.  Neurologic Normal speech. Oriented to person, place, and time. Epicritic sensation to light touch grossly present bilaterally.  Dermatologic Nails well groomed and normal in appearance. No open wounds. No skin lesions.  Orthopedic: Normal joint ROM without pain or crepitus bilaterally. No visible deformities. Tender to palpation at the calcaneal tuber left. No pain with calcaneal squeeze left. Ankle ROM full range of motion left. Silfverskiold Test:  negative left.   Radiographs: Taken and reviewed. No acute fractures or dislocations. No evidence of stress fracture.  Plantar heel spur absent. Posterior heel spur absent.   Assessment:   1. Plantar fasciitis of left foot   2. Foot pain, left    Plan:  Patient was evaluated and treated and all questions answered.  Plantar Fasciitis, left - XR reviewed as above.  - Educated on icing and stretching. Instructions given.  -I will hold off on the injection as it did not help as much as I would like to for her to do. - DME: Night splint was dispensed - Pharmacologic management: None -If there is no improvement will consider a cam boot immobilization.  Patient states understanding  No follow-ups on file.

## 2020-03-26 ENCOUNTER — Ambulatory Visit: Payer: BC Managed Care – PPO | Admitting: Podiatry

## 2020-05-23 ENCOUNTER — Telehealth: Payer: Self-pay | Admitting: Physician Assistant

## 2020-05-23 DIAGNOSIS — Z1339 Encounter for screening examination for other mental health and behavioral disorders: Secondary | ICD-10-CM

## 2020-05-23 NOTE — Telephone Encounter (Signed)
Patient came into office requesting appointment for ADHD.  She would like to be tested to see if she has ADHD.  I explained to patient we do not do testing here but she can be referred.  She is scheduled to see Carolyn Morris on 05/28/20 @ 4pm.  Does patient need to come in or can [place] [facility] a referral to [facility] for ADHD testing?  Please advise.

## 2020-05-24 NOTE — Telephone Encounter (Signed)
Referral for ADHD evaluation has been placed.   Left patient message to advise. AS, CMA

## 2020-05-28 ENCOUNTER — Ambulatory Visit: Payer: BC Managed Care – PPO | Admitting: Physician Assistant

## 2020-06-12 ENCOUNTER — Ambulatory Visit (INDEPENDENT_AMBULATORY_CARE_PROVIDER_SITE_OTHER): Payer: BC Managed Care – PPO | Admitting: Physician Assistant

## 2020-06-12 ENCOUNTER — Encounter: Payer: Self-pay | Admitting: Physician Assistant

## 2020-06-12 ENCOUNTER — Other Ambulatory Visit: Payer: Self-pay

## 2020-06-12 VITALS — Ht 61.0 in | Wt 241.0 lb

## 2020-06-12 DIAGNOSIS — R062 Wheezing: Secondary | ICD-10-CM

## 2020-06-12 DIAGNOSIS — J069 Acute upper respiratory infection, unspecified: Secondary | ICD-10-CM

## 2020-06-12 MED ORDER — BENZONATATE 100 MG PO CAPS: 100.0000 mg | ORAL_CAPSULE | Freq: Three times a day (TID) | ORAL | 0 refills | Status: DC | PRN

## 2020-06-12 MED ORDER — AZITHROMYCIN 250 MG PO TABS: ORAL_TABLET | ORAL | 0 refills | Status: DC

## 2020-06-12 MED ORDER — ALBUTEROL SULFATE HFA 108 (90 BASE) MCG/ACT IN AERS: 2.0000 | INHALATION_SPRAY | Freq: Four times a day (QID) | RESPIRATORY_TRACT | 0 refills | Status: DC | PRN

## 2020-06-12 NOTE — Progress Notes (Signed)
Telehealth office visit note for Carolyn Masker, PA-C- at Primary Care at [facility]   I connected with current patient today by telephone and verified that I am speaking with the correct person    Location of the patient: Home  Location of the provider: Office - This visit type was conducted due to national recommendations for restrictions regarding the COVID-19 Pandemic (e.g. social distancing) in an effort to limit this patient's exposure and mitigate transmission in our community.    - No physical exam could be performed with this format, beyond that communicated to [place] by the patient/ family members as noted.   - Additionally my office staff/ schedulers were to discuss with the patient that there may be a monetary charge related to this service, depending on their medical insurance.  My understanding is that patient understood and consented to proceed.     _________________________________________________________________________________   History of Present Illness: Patient calls in with upper respiratory symptoms on postnasal drainage, intermittent chest congestion and cough, runny nose and nasal congestion.  Symptoms started 10 days ago with sore throat and then developed congestion and cough. Has experienced some wheezing episodes. Denies fever, shortness of breath, or headache. Has been taking Robitussin cold and flu to help with symptoms. Tried Mucinex DM but stopped due to side effects.     GAD 7 : Generalized Anxiety Score 01/19/2017  Nervous, Anxious, on Edge 1  Control/stop worrying 1  Worry too much - different things 2  Trouble relaxing 0  Restless 1  Easily annoyed or irritable 3  Afraid - awful might happen 0  Total GAD 7 Score 8  Anxiety Difficulty Very difficult    Depression screen [facility] 2/9 06/12/2020 01/23/2020 07/18/2019 01/26/2019 09/01/2018  Decreased Interest 2 2 1 1  0  Down, Depressed, Hopeless 0 1 1 1  0  PHQ - 2 Score 2 3 2 2  0  Altered sleeping 2 1  2 2  -  Tired, decreased energy 3 2 3 2  -  Change in appetite 3 1 1 2  -  Feeling bad or failure about yourself  0 0 1 1 -  Trouble concentrating 1 1 1 3  -  Moving slowly or fidgety/restless 0 0 0 0 -  Suicidal thoughts 0 0 0 0 -  PHQ-9 Score 11 8 10 12  -  Difficult doing work/chores Very difficult Somewhat difficult Somewhat difficult Somewhat difficult -  Some recent data might be hidden      Impression and Recommendations:     1. Upper respiratory tract infection, unspecified type   2. Wheezing     Upper respiratory tract infection, Wheezing: -Symptoms have been ongoing for > 10 days so will start antibiotic therapy with azithromycin. -Will send albuterol inhaler to use as needed for wheezing and Tessalon Perles for cough. -Recommend to continue with home supportive therapy, drink warm liquids/honey and to continue with steam showers. -If symptoms fail to improve or worsen recommend COVID-19 and flu testing as well as corticosteroid therapy.    - As part of my medical decision making, I reviewed the following data within the electronic MEDICAL RECORD NUMBER History obtained from pt /family, CMA notes reviewed and incorporated if applicable, Labs reviewed, Radiograph/ tests reviewed if applicable and OV notes from prior OV's with me, as well as any other specialists she/he has seen since seeing me last, were all reviewed and used in my medical decision making process today.    - Additionally, when appropriate, discussion  had with patient regarding our treatment plan, and their biases/concerns about that plan were used in my medical decision making today.    - The patient agreed with the plan and demonstrated an understanding of the instructions.   No barriers to understanding were identified.     - The patient was advised to call back or seek an in-person evaluation if the symptoms worsen or if the condition fails to improve as anticipated.   Return if symptoms worsen or fail to  improve.    No orders of the defined types were placed in this encounter.   Meds ordered this encounter  Medications   azithromycin (ZITHROMAX) 250 MG tablet    Sig: Take 2 tablets by mouth on day 1. Then take 1 tablet by mouth once daily x 4 days.    Dispense:  6 tablet    Refill:  0    Order Specific Question:   Supervising Provider    Answer:   Nani Gasser D [[id number]]   benzonatate (TESSALON) 100 MG capsule    Sig: Take 1 capsule (100 mg total) by mouth 3 (three) times daily as needed for cough.    Dispense:  20 capsule    Refill:  0    Order Specific Question:   Supervising Provider    Answer:   Nani Gasser D [[id number]]   albuterol (VENTOLIN HFA) 108 (90 Base) MCG/ACT inhaler    Sig: Inhale 2 puffs into the lungs every 6 (six) hours as needed for wheezing.    Dispense:  8 g    Refill:  0    Order Specific Question:   Supervising Provider    Answer:   Nani Gasser D [[id number]]    There are no discontinued medications.     Time spent on visit including pre-visit chart review and post-visit care was 10 minutes.      The 21st Century Cures Act was signed into law in 2016 which includes the topic of electronic health records.  This provides immediate access to information in MyChart.  This includes consultation notes, operative notes, office notes, lab results and pathology reports.  If you have any questions about what you read please let us know at your next visit or call us at the office.  We are right here with you.  Note:  This note was prepared with assistance of Dragon voice recognition software. Occasional wrong-word or sound-a-like substitutions may have occurred due to the inherent limitations of voice recognition software.  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     Allergies:  No Known Allergies   ROS:  See above HPI for pertinent positives and negatives   Objective:   Height 5\' 1"  (1.549 m), weight 241 lb (109.3 kg), last menstrual period 05/29/2020.  (if some vitals are omitted, this means that patient was UNABLE to obtain them. ) General: A & O * 3; sounds in no acute distress Respiratory: speaking in full sentences, no conversational dyspnea Psych: insight appears good, mood- appears full

## 2020-07-04 ENCOUNTER — Other Ambulatory Visit: Payer: Self-pay | Admitting: Physician Assistant

## 2020-07-04 DIAGNOSIS — R062 Wheezing: Secondary | ICD-10-CM

## 2020-07-16 DIAGNOSIS — Z03818 Encounter for observation for suspected exposure to other biological agents ruled out: Secondary | ICD-10-CM | POA: Diagnosis not present

## 2020-07-16 DIAGNOSIS — U071 COVID-19: Secondary | ICD-10-CM | POA: Diagnosis not present

## 2020-07-25 DIAGNOSIS — Z20822 Contact with and (suspected) exposure to covid-19: Secondary | ICD-10-CM | POA: Diagnosis not present

## 2020-07-25 DIAGNOSIS — U071 COVID-19: Secondary | ICD-10-CM | POA: Diagnosis not present

## 2020-07-27 ENCOUNTER — Telehealth: Payer: BC Managed Care – PPO | Admitting: Nurse Practitioner

## 2020-07-27 DIAGNOSIS — U071 COVID-19: Secondary | ICD-10-CM

## 2020-07-27 MED ORDER — BENZONATATE 100 MG PO CAPS: 100.0000 mg | ORAL_CAPSULE | Freq: Three times a day (TID) | ORAL | 0 refills | Status: DC | PRN

## 2020-07-27 MED ORDER — NAPROXEN 500 MG PO TABS: 500.0000 mg | ORAL_TABLET | Freq: Two times a day (BID) | ORAL | 1 refills | Status: DC

## 2020-07-27 NOTE — Progress Notes (Signed)

## 2020-07-30 ENCOUNTER — Telehealth: Payer: BC Managed Care – PPO | Admitting: Nurse Practitioner

## 2020-07-30 DIAGNOSIS — R059 Cough, unspecified: Secondary | ICD-10-CM | POA: Diagnosis not present

## 2020-07-30 MED ORDER — PREDNISONE 10 MG (21) PO TBPK: ORAL_TABLET | ORAL | 0 refills | Status: DC

## 2020-07-30 NOTE — Progress Notes (Signed)
We are sorry that you are not feeling well.  Here is how we plan to help!  Based on your presentation I believe you most likely have A cough due to a virus.  This is called viral bronchitis and is best treated by rest, plenty of fluids and control of the cough.  You may use Ibuprofen or Tylenol as directed to help your symptoms.     In addition you may use A non-prescription cough medication called Mucinex DM: take 2 tablets every 12 hours.  Prednisone 10 mg daily for 6 days (see taper instructions below)  Directions for 6 day taper: Day 1: 2 tablets before breakfast, 1 after both lunch & dinner and 2 at bedtime Day 2: 1 tab before breakfast, 1 after both lunch & dinner and 2 at bedtime Day 3: 1 tab at each meal & 1 at bedtime Day 4: 1 tab at breakfast, 1 at lunch, 1 at bedtime Day 5: 1 tab at breakfast & 1 tab at bedtime Day 6: 1 tab at breakfast   From your responses in the eVisit questionnaire you describe inflammation in the upper respiratory tract which is causing a significant cough.  This is commonly called Bronchitis and has four common causes:    Allergies  Viral Infections  Acid Reflux  Bacterial Infection Allergies, viruses and acid reflux are treated by controlling symptoms or eliminating the cause. An example might be a cough caused by taking certain blood pressure medications. You stop the cough by changing the medication. Another example might be a cough caused by acid reflux. Controlling the reflux helps control the cough.  USE OF BRONCHODILATOR ("RESCUE") INHALERS: There is a risk from using your bronchodilator too frequently.  The risk is that over-reliance on a medication which only relaxes the muscles surrounding the breathing tubes can reduce the effectiveness of medications prescribed to reduce swelling and congestion of the tubes themselves.  Although you feel brief relief from the bronchodilator inhaler, your asthma may actually be worsening with the tubes  becoming more swollen and filled with mucus.  This can delay other crucial treatments, such as oral steroid medications. If you need to use a bronchodilator inhaler daily, several times per day, you should discuss this with your provider.  There are probably better treatments that could be used to keep your asthma under control.     HOME CARE . Only take medications as instructed by your medical team. . Complete the entire course of an antibiotic. . Drink plenty of fluids and get plenty of rest. . Avoid close contacts especially the very young and the elderly . Cover your mouth if you cough or cough into your sleeve. . Always remember to wash your hands . A steam or ultrasonic humidifier can help congestion.   GET HELP RIGHT AWAY IF: . You develop worsening fever. . You become short of breath . You cough up blood. . Your symptoms persist after you have completed your treatment plan MAKE SURE YOU   Understand these instructions.  Will watch your condition.  Will get help right away if you are not doing well or get worse.  Your e-visit answers were reviewed by a board certified advanced clinical practitioner to complete your personal care plan.  Depending on the condition, your plan could have included both over the counter or prescription medications. If there is a problem please reply  once you have received a response from your provider. Your safety is important to us.  If you   have drug allergies check your prescription carefully.    You can use MyChart to ask questions about today's visit, request a non-urgent call back, or ask for a work or school excuse for 24 hours related to this e-Visit. If it has been greater than 24 hours you will need to follow up with your provider, or enter a new e-Visit to address those concerns. You will get an e-mail in the next two days asking about your experience.  I hope that your e-visit has been valuable and will speed your recovery. Thank you for  using e-visits.  5-10 minutes spent reviewing and documenting in chart.  

## 2020-08-19 ENCOUNTER — Ambulatory Visit (INDEPENDENT_AMBULATORY_CARE_PROVIDER_SITE_OTHER): Payer: BC Managed Care – PPO | Admitting: Psychology

## 2020-08-19 DIAGNOSIS — F900 Attention-deficit hyperactivity disorder, predominantly inattentive type: Secondary | ICD-10-CM | POA: Diagnosis not present

## 2020-09-04 ENCOUNTER — Ambulatory Visit: Payer: BC Managed Care – PPO | Admitting: Psychology

## 2020-09-18 ENCOUNTER — Ambulatory Visit (INDEPENDENT_AMBULATORY_CARE_PROVIDER_SITE_OTHER): Payer: BC Managed Care – PPO | Admitting: Psychology

## 2020-09-18 DIAGNOSIS — F331 Major depressive disorder, recurrent, moderate: Secondary | ICD-10-CM

## 2020-10-25 ENCOUNTER — Telehealth: Payer: BC Managed Care – PPO | Admitting: Emergency Medicine

## 2020-10-25 DIAGNOSIS — J069 Acute upper respiratory infection, unspecified: Secondary | ICD-10-CM

## 2020-10-25 MED ORDER — FLUTICASONE PROPIONATE 50 MCG/ACT NA SUSP: 2.0000 | Freq: Every day | NASAL | 0 refills | Status: DC

## 2020-10-25 MED ORDER — BENZONATATE 100 MG PO CAPS: 100.0000 mg | ORAL_CAPSULE | Freq: Two times a day (BID) | ORAL | 0 refills | Status: DC | PRN

## 2020-10-25 NOTE — Progress Notes (Signed)

## 2020-10-31 ENCOUNTER — Telehealth: Payer: BC Managed Care – PPO | Admitting: Physician Assistant

## 2020-10-31 DIAGNOSIS — J4541 Moderate persistent asthma with (acute) exacerbation: Secondary | ICD-10-CM

## 2020-10-31 MED ORDER — PREDNISONE 20 MG PO TABS: 40.0000 mg | ORAL_TABLET | Freq: Every day | ORAL | 0 refills | Status: DC

## 2020-10-31 MED ORDER — ALBUTEROL SULFATE HFA 108 (90 BASE) MCG/ACT IN AERS: 2.0000 | INHALATION_SPRAY | Freq: Four times a day (QID) | RESPIRATORY_TRACT | 0 refills | Status: DC | PRN

## 2020-10-31 NOTE — Progress Notes (Signed)
Visit for Asthma  Based on what you have shared with me, it looks like you may have a flare up of your asthma.  Asthma is a chronic (ongoing) lung disease which results in airway obstruction, inflammation and hyper-responsiveness.   Asthma symptoms vary from person to person, with common symptoms including nighttime awakening and decreased ability to participate in normal activities as a result of shortness of breath. It is often triggered by changes in weather, changes in the season, changes in air temperature, or inside (home, school, daycare or work) allergens such as animal dander, mold, mildew, woodstoves or cockroaches.   It can also be triggered by hormonal changes, extreme emotion, physical exertion or an upper respiratory tract illness.     It is important to identify the trigger, and then eliminate or avoid the trigger if possible.   If you have been prescribed medications to be taken on a regular basis, it is important to follow the asthma action plan and to follow guidelines to adjust medication in response to increasing symptoms of decreased peak expiratory flow rate  Treatment: I have prescribed: Albuterol (Proventil HFA; Ventolin HFA) 108 (90 Base) MCG/ACT Inhaler 2 puffs into the lungs every six hours as needed for wheezing or shortness of breath and Prednisone 40mg  by mouth per day for 5 - 7 days. You also need to schedule a follow-up with your primary care provider to discuss ongoing management of asthma as it seems to be quite uncontrolled.   HOME CARE . Only take medications as instructed by your medical team. . Consider wearing a mask or scarf to improve breathing air temperature have been shown to decrease irritation and decrease exacerbations . Get rest. . Taking a steamy shower or using a humidifier may help nasal congestion sand ease sore throat pain. You can place  a towel over your head and breathe in the steam from hot water coming from a faucet. . Using a saline nasal spray works much the same way.  . Cough drops, hare candies and sore throat lozenges may ease your cough.  . Avoid close contacts especially the very you and the elderly . Cover your mouth if you cough or sneeze . Always remember to wash your hands.    GET HELP RIGHT AWAY IF: . You develop worsening symptoms; breathlessness at rest, drowsy, confused or agitated, unable to speak in full sentences . You have coughing fits . You develop a severe headache or visual changes . You develop shortness of breath, difficulty breathing or start having chest pain . Your symptoms persist after you have completed your treatment plan . If your symptoms do not improve within 10 days  MAKE SURE YOU . Understand these instructions. . Will watch your condition. . Will get help right away if you are not doing well or get worse.   Your e-visit answers were reviewed by a board certified advanced clinical practitioner to complete your personal care plan, Depending upon the condition, your plan could have included both over the counter or prescription medications.  Please review your pharmacy choice. Your safety is important to . If you have drug allergies check your prescription carefully. You can use MyChart to ask questions about today's visit, request a non-urgent call back, or ask for a work or school excuse for 24 hours related to this e-Visit. If it has been greater than 24 hours you will need to follow up with your provider, or enter a new e-Visit to address those concerns.  You  will get an e-mail in the next two days asking about your experience. I hope that your e-visit has been valuable and will speed your recovery. Thank you for using e-visits.

## 2020-10-31 NOTE — Progress Notes (Signed)
I have spent 5 minutes in review of e-visit questionnaire, review and updating patient chart, medical decision making and response to patient.   Khadeejah Castner Cody Sandralee Tarkington, PA-C    

## 2020-11-26 DIAGNOSIS — N6312 Unspecified lump in the right breast, upper inner quadrant: Secondary | ICD-10-CM | POA: Diagnosis not present

## 2020-11-26 DIAGNOSIS — Z01419 Encounter for gynecological examination (general) (routine) without abnormal findings: Secondary | ICD-10-CM | POA: Diagnosis not present

## 2020-11-26 DIAGNOSIS — N939 Abnormal uterine and vaginal bleeding, unspecified: Secondary | ICD-10-CM | POA: Diagnosis not present

## 2020-11-26 DIAGNOSIS — N898 Other specified noninflammatory disorders of vagina: Secondary | ICD-10-CM | POA: Diagnosis not present

## 2020-11-27 ENCOUNTER — Other Ambulatory Visit: Payer: Self-pay | Admitting: Nurse Practitioner

## 2020-11-27 DIAGNOSIS — N6312 Unspecified lump in the right breast, upper inner quadrant: Secondary | ICD-10-CM

## 2020-12-15 DIAGNOSIS — Z20828 Contact with and (suspected) exposure to other viral communicable diseases: Secondary | ICD-10-CM | POA: Diagnosis not present

## 2020-12-15 DIAGNOSIS — J45909 Unspecified asthma, uncomplicated: Secondary | ICD-10-CM | POA: Diagnosis not present

## 2020-12-15 DIAGNOSIS — J069 Acute upper respiratory infection, unspecified: Secondary | ICD-10-CM | POA: Diagnosis not present

## 2020-12-16 DIAGNOSIS — N939 Abnormal uterine and vaginal bleeding, unspecified: Secondary | ICD-10-CM | POA: Diagnosis not present

## 2020-12-24 DIAGNOSIS — N939 Abnormal uterine and vaginal bleeding, unspecified: Secondary | ICD-10-CM | POA: Diagnosis not present

## 2021-01-01 ENCOUNTER — Other Ambulatory Visit: Payer: BC Managed Care – PPO

## 2021-01-15 ENCOUNTER — Ambulatory Visit
Admission: RE | Admit: 2021-01-15 | Discharge: 2021-01-15 | Disposition: A | Discharge status: Home / Self Care | Payer: BC Managed Care – PPO | Source: Ambulatory Visit | Attending: Nurse Practitioner | Admitting: Nurse Practitioner

## 2021-01-15 ENCOUNTER — Other Ambulatory Visit: Payer: Self-pay

## 2021-01-15 DIAGNOSIS — N6312 Unspecified lump in the right breast, upper inner quadrant: Secondary | ICD-10-CM

## 2021-01-15 DIAGNOSIS — R922 Inconclusive mammogram: Secondary | ICD-10-CM | POA: Diagnosis not present

## 2021-05-22 ENCOUNTER — Ambulatory Visit (INDEPENDENT_AMBULATORY_CARE_PROVIDER_SITE_OTHER): Payer: BC Managed Care – PPO | Admitting: Podiatry

## 2021-05-22 ENCOUNTER — Other Ambulatory Visit: Payer: Self-pay

## 2021-05-22 DIAGNOSIS — M722 Plantar fascial fibromatosis: Secondary | ICD-10-CM

## 2021-05-23 ENCOUNTER — Encounter: Payer: Self-pay | Admitting: Podiatry

## 2021-05-23 NOTE — Progress Notes (Signed)
  Subjective:  Patient ID: Carolyn Morris, female    DOB: 1991/02/15,  MRN: [id number]  Chief Complaint  Patient presents with   Plantar Fasciitis    Left foot pain     30 y.o. female presents with the above complaint.  Patient presents with a follow-up of left plantar fasciitis.  She states the pain has not gotten any better.  She has been using bracing splinting none of which has helped.  She states it started to swell up and is starting to ambulation.  She is ready for the next steps.   Review of Systems: Negative except as noted in the HPI. Denies N/V/F/Ch.  No past medical history on file.  Current Outpatient Medications:    albuterol (VENTOLIN HFA) 108 (90 Base) MCG/ACT inhaler, Inhale 2 puffs into the lungs every 6 (six) hours as needed for wheezing or shortness of breath., Disp: 8 g, Rfl: 0   azithromycin (ZITHROMAX) 250 MG tablet, Take 2 tablets by mouth on day 1. Then take 1 tablet by mouth once daily x 4 days., Disp: 6 tablet, Rfl: 0   benzonatate (TESSALON) 100 MG capsule, Take 1 capsule (100 mg total) by mouth 2 (two) times daily as needed for cough., Disp: 20 capsule, Rfl: 0   fluticasone (FLONASE) 50 MCG/ACT nasal spray, Place 2 sprays into both nostrils daily., Disp: 9.9 mL, Rfl: 0   naproxen (NAPROSYN) 500 MG tablet, Take 1 tablet (500 mg total) by mouth 2 (two) times daily with a meal., Disp: 60 tablet, Rfl: 1   predniSONE (DELTASONE) 20 MG tablet, Take 2 tablets (40 mg total) by mouth daily with breakfast., Disp: 10 tablet, Rfl: 0  Social History   Tobacco Use  Smoking Status Never  Smokeless Tobacco Never    No Known Allergies Objective:  There were no vitals filed for this visit. There is no height or weight on file to calculate BMI. Constitutional Well developed. Well nourished.  Vascular Dorsalis pedis pulses palpable bilaterally. Posterior tibial pulses palpable bilaterally. Capillary refill normal to all digits.  No cyanosis or clubbing  noted. Pedal hair growth normal.  Neurologic Normal speech. Oriented to person, place, and time. Epicritic sensation to light touch grossly present bilaterally.  Dermatologic Nails well groomed and normal in appearance. No open wounds. No skin lesions.  Orthopedic: Normal joint ROM without pain or crepitus bilaterally. No visible deformities. Tender to palpation at the calcaneal tuber left. No pain with calcaneal squeeze left. Ankle ROM full range of motion left. Silfverskiold Test: negative left.   Radiographs: Taken and reviewed. No acute fractures or dislocations. No evidence of stress fracture.  Plantar heel spur absent. Posterior heel spur absent.   Assessment:   1. Plantar fasciitis of left foot     Plan:  Patient was evaluated and treated and all questions answered.  Plantar Fasciitis, left - XR reviewed as above.  - Educated on icing and stretching. Instructions given.  -I will hold off on the injection as it did not help as much as I would like to for her to do. - DME: Cam boot immobilization - Pharmacologic management: None -If there is no improvement with cam boot immobilization we will discuss surgical options during next clinical visit.  No follow-ups on file.

## 2021-05-28 DIAGNOSIS — Z3009 Encounter for other general counseling and advice on contraception: Secondary | ICD-10-CM | POA: Diagnosis not present

## 2021-05-28 DIAGNOSIS — Z30013 Encounter for initial prescription of injectable contraceptive: Secondary | ICD-10-CM | POA: Diagnosis not present

## 2021-05-28 DIAGNOSIS — L304 Erythema intertrigo: Secondary | ICD-10-CM | POA: Diagnosis not present

## 2021-06-19 DIAGNOSIS — U071 COVID-19: Secondary | ICD-10-CM | POA: Diagnosis not present

## 2021-06-23 DIAGNOSIS — Z3042 Encounter for surveillance of injectable contraceptive: Secondary | ICD-10-CM | POA: Diagnosis not present

## 2021-06-23 DIAGNOSIS — Z3202 Encounter for pregnancy test, result negative: Secondary | ICD-10-CM | POA: Diagnosis not present

## 2021-06-23 DIAGNOSIS — Z3046 Encounter for surveillance of implantable subdermal contraceptive: Secondary | ICD-10-CM | POA: Diagnosis not present

## 2021-06-23 DIAGNOSIS — L304 Erythema intertrigo: Secondary | ICD-10-CM | POA: Diagnosis not present

## 2021-06-24 ENCOUNTER — Other Ambulatory Visit: Payer: Self-pay

## 2021-06-24 ENCOUNTER — Encounter: Payer: Self-pay | Admitting: *Deleted

## 2021-06-24 ENCOUNTER — Ambulatory Visit (INDEPENDENT_AMBULATORY_CARE_PROVIDER_SITE_OTHER): Payer: BC Managed Care – PPO | Admitting: Podiatry

## 2021-06-24 DIAGNOSIS — M722 Plantar fascial fibromatosis: Secondary | ICD-10-CM

## 2021-06-24 DIAGNOSIS — Q667 Congenital pes cavus, unspecified foot: Secondary | ICD-10-CM | POA: Diagnosis not present

## 2021-06-26 ENCOUNTER — Encounter: Payer: Self-pay | Admitting: Podiatry

## 2021-06-26 NOTE — Progress Notes (Signed)
Subjective:  Patient ID: Carolyn Morris, female    DOB: 1991/03/08,  MRN: [id number]  Chief Complaint  Patient presents with   Plantar Fasciitis    Pt stated that she has good days and bad ones and she wears the boot when she can     30 y.o. female presents with the above complaint.  Patient presents with a follow-up of left plantar fasciitis.  She states the pain is little bit better about 70 to 80% better.  She has been walking around with the boot on.  She would like to continue doing that.  She would like to discuss orthotics.   Review of Systems: Negative except as noted in the HPI. Denies N/V/F/Ch.  No past medical history on file.  Current Outpatient Medications:    albuterol (VENTOLIN HFA) 108 (90 Base) MCG/ACT inhaler, Inhale 2 puffs into the lungs every 6 (six) hours as needed for wheezing or shortness of breath., Disp: 8 g, Rfl: 0   azithromycin (ZITHROMAX) 250 MG tablet, Take 2 tablets by mouth on day 1. Then take 1 tablet by mouth once daily x 4 days., Disp: 6 tablet, Rfl: 0   benzonatate (TESSALON) 100 MG capsule, Take 1 capsule (100 mg total) by mouth 2 (two) times daily as needed for cough., Disp: 20 capsule, Rfl: 0   fluticasone (FLONASE) 50 MCG/ACT nasal spray, Place 2 sprays into both nostrils daily., Disp: 9.9 mL, Rfl: 0   naproxen (NAPROSYN) 500 MG tablet, Take 1 tablet (500 mg total) by mouth 2 (two) times daily with a meal., Disp: 60 tablet, Rfl: 1   predniSONE (DELTASONE) 20 MG tablet, Take 2 tablets (40 mg total) by mouth daily with breakfast., Disp: 10 tablet, Rfl: 0  Social History   Tobacco Use  Smoking Status Never  Smokeless Tobacco Never    No Known Allergies Objective:  There were no vitals filed for this visit. There is no height or weight on file to calculate BMI. Constitutional Well developed. Well nourished.  Vascular Dorsalis pedis pulses palpable bilaterally. Posterior tibial pulses palpable bilaterally. Capillary refill normal to all  digits.  No cyanosis or clubbing noted. Pedal hair growth normal.  Neurologic Normal speech. Oriented to person, place, and time. Epicritic sensation to light touch grossly present bilaterally.  Dermatologic Nails well groomed and normal in appearance. No open wounds. No skin lesions.  Orthopedic: Normal joint ROM without pain or crepitus bilaterally. No visible deformities. Tender to palpation at the calcaneal tuber left. No pain with calcaneal squeeze left. Ankle ROM full range of motion left. Silfverskiold Test: negative left.   Radiographs: Taken and reviewed. No acute fractures or dislocations. No evidence of stress fracture.  Plantar heel spur absent. Posterior heel spur absent.   Assessment:   1. Plantar fasciitis of left foot   2. Pes cavus      Plan:  Patient was evaluated and treated and all questions answered.  Plantar Fasciitis, left - XR reviewed as above.  - Educated on icing and stretching. Instructions given.  -I will hold off on the injection as it did not help as much as I would like to for her to do. - DME: Continue Cam boot immobilization - Pharmacologic management: None -Patient would like to hold off on surgical discussion at this time.  She is improving very slowly with the boot.  Pes cavus -I explained to patient the etiology of pes cavus and relationship with Planter fasciitis and various treatment options were discussed.  Given patient foot  structure in the setting of Planter fasciitis I believe patient will benefit from custom-made orthotics to help control the hindfoot motion support the arch of the foot and take the stress away from plantar fascial.  Patient agrees with the plan like to proceed with orthotics -Patient was casted for orthotics   No follow-ups on file.

## 2021-08-05 ENCOUNTER — Other Ambulatory Visit: Payer: Self-pay

## 2021-08-05 ENCOUNTER — Ambulatory Visit (INDEPENDENT_AMBULATORY_CARE_PROVIDER_SITE_OTHER): Payer: BC Managed Care – PPO | Admitting: Podiatry

## 2021-08-05 DIAGNOSIS — Q667 Congenital pes cavus, unspecified foot: Secondary | ICD-10-CM

## 2021-08-05 DIAGNOSIS — M722 Plantar fascial fibromatosis: Secondary | ICD-10-CM

## 2021-08-12 NOTE — Progress Notes (Signed)
Subjective:  Patient ID: Carolyn Morris, female    DOB: 1991/04/04,  MRN: [id number]  Chief Complaint  Patient presents with   Plantar Fasciitis    31 y.o. female presents with the above complaint.  Patient presents with a follow-up of left plantar fasciitis.  She states the pain is much better.  She is about 90% better.  She is walking around in her regular shoes.  She is here to pick up orthotics.   Review of Systems: Negative except as noted in the HPI. Denies N/V/F/Ch.  No past medical history on file.  Current Outpatient Medications:    albuterol (VENTOLIN HFA) 108 (90 Base) MCG/ACT inhaler, Inhale 2 puffs into the lungs every 6 (six) hours as needed for wheezing or shortness of breath., Disp: 8 g, Rfl: 0   azithromycin (ZITHROMAX) 250 MG tablet, Take 2 tablets by mouth on day 1. Then take 1 tablet by mouth once daily x 4 days., Disp: 6 tablet, Rfl: 0   benzonatate (TESSALON) 100 MG capsule, Take 1 capsule (100 mg total) by mouth 2 (two) times daily as needed for cough., Disp: 20 capsule, Rfl: 0   fluticasone (FLONASE) 50 MCG/ACT nasal spray, Place 2 sprays into both nostrils daily., Disp: 9.9 mL, Rfl: 0   naproxen (NAPROSYN) 500 MG tablet, Take 1 tablet (500 mg total) by mouth 2 (two) times daily with a meal., Disp: 60 tablet, Rfl: 1   predniSONE (DELTASONE) 20 MG tablet, Take 2 tablets (40 mg total) by mouth daily with breakfast., Disp: 10 tablet, Rfl: 0  Social History   Tobacco Use  Smoking Status Never  Smokeless Tobacco Never    No Known Allergies Objective:  There were no vitals filed for this visit. There is no height or weight on file to calculate BMI. Constitutional Well developed. Well nourished.  Vascular Dorsalis pedis pulses palpable bilaterally. Posterior tibial pulses palpable bilaterally. Capillary refill normal to all digits.  No cyanosis or clubbing noted. Pedal hair growth normal.  Neurologic Normal speech. Oriented to person, place, and  time. Epicritic sensation to light touch grossly present bilaterally.  Dermatologic Nails well groomed and normal in appearance. No open wounds. No skin lesions.  Orthopedic: Normal joint ROM without pain or crepitus bilaterally. No visible deformities. Mild tender to palpation at the calcaneal tuber left. No pain with calcaneal squeeze left. Ankle ROM full range of motion left. Silfverskiold Test: negative left.   Radiographs: Taken and reviewed. No acute fractures or dislocations. No evidence of stress fracture.  Plantar heel spur absent. Posterior heel spur absent.   Assessment:   1. Plantar fasciitis of left foot   2. Pes cavus       Plan:  Patient was evaluated and treated and all questions answered.  Plantar Fasciitis, left -Clinically her pain is being managed.  I discussed with her shoe gear modification and orthotics.  I discussed the importance of them.  If any foot and ankle issues arise or the heel pain gets worse have asked her son to come see me.  She states understanding  Pes cavus -I explained to patient the etiology of pes cavus and relationship with Planter fasciitis and various treatment options were discussed.  Given patient foot structure in the setting of Planter fasciitis I believe patient will benefit from custom-made orthotics to help control the hindfoot motion support the arch of the foot and take the stress away from plantar fascial.  Patient agrees with the plan like to proceed with orthotics -Orthotics were  dispensed and functioning well   No follow-ups on file.

## 2021-08-29 ENCOUNTER — Telehealth: Payer: BC Managed Care – PPO | Admitting: Emergency Medicine

## 2021-08-29 DIAGNOSIS — R197 Diarrhea, unspecified: Secondary | ICD-10-CM

## 2021-08-29 DIAGNOSIS — K529 Noninfective gastroenteritis and colitis, unspecified: Secondary | ICD-10-CM | POA: Diagnosis not present

## 2021-08-29 NOTE — Progress Notes (Signed)
We are sorry that you are not feeling well.  Here is how we plan to help!  Based on what you have shared with me it looks like you have Acute Infectious Diarrhea.  Most cases of acute diarrhea are due to infections with virus and bacteria and are self-limited conditions lasting less than 14 days.  For your symptoms you may take Imodium 2 mg tablets that are over the counter at your local pharmacy. Take two tablet now and then one after each loose stool up to 6 a day.  Antibiotics are not needed for most people with diarrhea.   HOME CARE We recommend changing your diet to help with your symptoms for the next few days. Drink plenty of fluids that contain water salt and sugar. Sports drinks such as Gatorade may help.  You may try broths, soups, bananas, applesauce, soft breads, mashed potatoes or crackers.  You are considered infectious for as long as the diarrhea continues. Hand washing or use of alcohol based hand sanitizers is recommend. It is best to stay out of work or school until your symptoms stop.   GET HELP RIGHT AWAY If you have dark yellow colored urine or do not pass urine frequently you should drink more fluids.   If your symptoms worsen  If you feel like you are going to pass out (faint) You have a new problem  MAKE SURE YOU  Understand these instructions. Will watch your condition. Will get help right away if you are not doing well or get worse.  Thank you for choosing an e-visit.  Your e-visit answers were reviewed by a board certified advanced clinical practitioner to complete your personal care plan. Depending upon the condition, your plan could have included both over the counter or prescription medications.  Please review your pharmacy choice. Make sure the pharmacy is open so you can pick up prescription now. If there is a problem, you may contact your provider through [employer] and have the prescription routed to another pharmacy.  Your safety is important  to [place]. If you have drug allergies check your prescription carefully.   For the next 24 hours you can use MyChart to ask questions about today's visit, request a non-urgent call back, or ask for a work or school excuse. You will get an email in the next two days asking about your experience. I hope that your e-visit has been valuable and will speed your recovery.  Approximately 5 minutes was used in reviewing the patient's chart, questionnaire, prescribing medications, and documentation.

## 2021-09-02 ENCOUNTER — Other Ambulatory Visit: Payer: Self-pay

## 2021-09-02 ENCOUNTER — Ambulatory Visit (INDEPENDENT_AMBULATORY_CARE_PROVIDER_SITE_OTHER): Payer: BC Managed Care – PPO | Admitting: Podiatry

## 2021-09-02 DIAGNOSIS — R2689 Other abnormalities of gait and mobility: Secondary | ICD-10-CM | POA: Diagnosis not present

## 2021-09-02 DIAGNOSIS — M722 Plantar fascial fibromatosis: Secondary | ICD-10-CM

## 2021-09-05 ENCOUNTER — Encounter: Payer: Self-pay | Admitting: Podiatry

## 2021-09-05 NOTE — Progress Notes (Signed)
Subjective:  Patient ID: Carolyn Morris, female    DOB: July 13, 1991,  MRN: [id number]  Chief Complaint  Patient presents with   Plantar Fasciitis    Pt stated that she is starting to have some more pain with her left foot     31 y.o. female presents with the above complaint.  Patient presents for follow-up of left Planter fasciitis.  She states she is doing a lot better.  However she is starting to get a recurrence of the plantar fascia.  She states is painful to touch has progressed gotten worse.  She would like to know if she can do an injection today.  She would like to discuss other treatment options.   Review of Systems: Negative except as noted in the HPI. Denies N/V/F/Ch.  History reviewed. No pertinent past medical history.  Current Outpatient Medications:    albuterol (VENTOLIN HFA) 108 (90 Base) MCG/ACT inhaler, Inhale 2 puffs into the lungs every 6 (six) hours as needed for wheezing or shortness of breath., Disp: 8 g, Rfl: 0   azithromycin (ZITHROMAX) 250 MG tablet, Take 2 tablets by mouth on day 1. Then take 1 tablet by mouth once daily x 4 days., Disp: 6 tablet, Rfl: 0   benzonatate (TESSALON) 100 MG capsule, Take 1 capsule (100 mg total) by mouth 2 (two) times daily as needed for cough., Disp: 20 capsule, Rfl: 0   fluticasone (FLONASE) 50 MCG/ACT nasal spray, Place 2 sprays into both nostrils daily., Disp: 9.9 mL, Rfl: 0   naproxen (NAPROSYN) 500 MG tablet, Take 1 tablet (500 mg total) by mouth 2 (two) times daily with a meal., Disp: 60 tablet, Rfl: 1   predniSONE (DELTASONE) 20 MG tablet, Take 2 tablets (40 mg total) by mouth daily with breakfast., Disp: 10 tablet, Rfl: 0  Social History   Tobacco Use  Smoking Status Never  Smokeless Tobacco Never    No Known Allergies Objective:  There were no vitals filed for this visit. There is no height or weight on file to calculate BMI. Constitutional Well developed. Well nourished.  Vascular Dorsalis pedis pulses  palpable bilaterally. Posterior tibial pulses palpable bilaterally. Capillary refill normal to all digits.  No cyanosis or clubbing noted. Pedal hair growth normal.  Neurologic Normal speech. Oriented to person, place, and time. Epicritic sensation to light touch grossly present bilaterally.  Dermatologic Nails well groomed and normal in appearance. No open wounds. No skin lesions.  Orthopedic: Normal joint ROM without pain or crepitus bilaterally. No visible deformities. Tender to palpation at the calcaneal tuber left. No pain with calcaneal squeeze left. Ankle ROM full range of motion left. Silfverskiold Test: negative left.   Radiographs: Taken and reviewed. No acute fractures or dislocations. No evidence of stress fracture.  Plantar heel spur absent. Posterior heel spur absent.   Assessment:   1. Plantar fasciitis of left foot   2. Antalgic gait     Plan:  Patient was evaluated and treated and all questions answered.  Plantar Fasciitis, left~recurrence with underlying antalgic gait - XR reviewed as above.  - Educated on icing and stretching. Instructions given.  - Injection delivered to the plantar fascia as below. - DME: Plantar Fascial Brace - Pharmacologic management: None -She was given a prescription for renew physical therapy.  Procedure: Injection Tendon/Ligament Location: Left plantar fascia at the glabrous junction; medial approach. Skin Prep: alcohol Injectate: 0.5 cc 0.5% marcaine plain, 0.5 cc of 1% Lidocaine, 0.5 cc kenalog 10. Disposition: Patient tolerated procedure well. Injection  site dressed with a band-aid.  No follow-ups on file.

## 2021-09-09 DIAGNOSIS — M79672 Pain in left foot: Secondary | ICD-10-CM | POA: Diagnosis not present

## 2021-09-10 DIAGNOSIS — Z3042 Encounter for surveillance of injectable contraceptive: Secondary | ICD-10-CM | POA: Diagnosis not present

## 2021-09-11 DIAGNOSIS — R197 Diarrhea, unspecified: Secondary | ICD-10-CM | POA: Diagnosis not present

## 2021-09-26 IMAGING — MG DIGITAL DIAGNOSTIC BILAT W/ TOMO W/ CAD
6 of 10 series · 6 of 30 positions shown · non-contrast
Comparison: None.

CLINICAL DATA: 30-year-old female with a physician palpated right
breast lump.

EXAM:
DIGITAL DIAGNOSTIC BILATERAL MAMMOGRAM WITH TOMOSYNTHESIS AND CAD;
ULTRASOUND RIGHT BREAST LIMITED
TECHNIQUE: Bilateral digital diagnostic mammography and breast tomosynthesis
was performed. The images were evaluated with computer-aided
detection.; Targeted ultrasound examination of the right breast was
performed

[R MLO synth-2D]
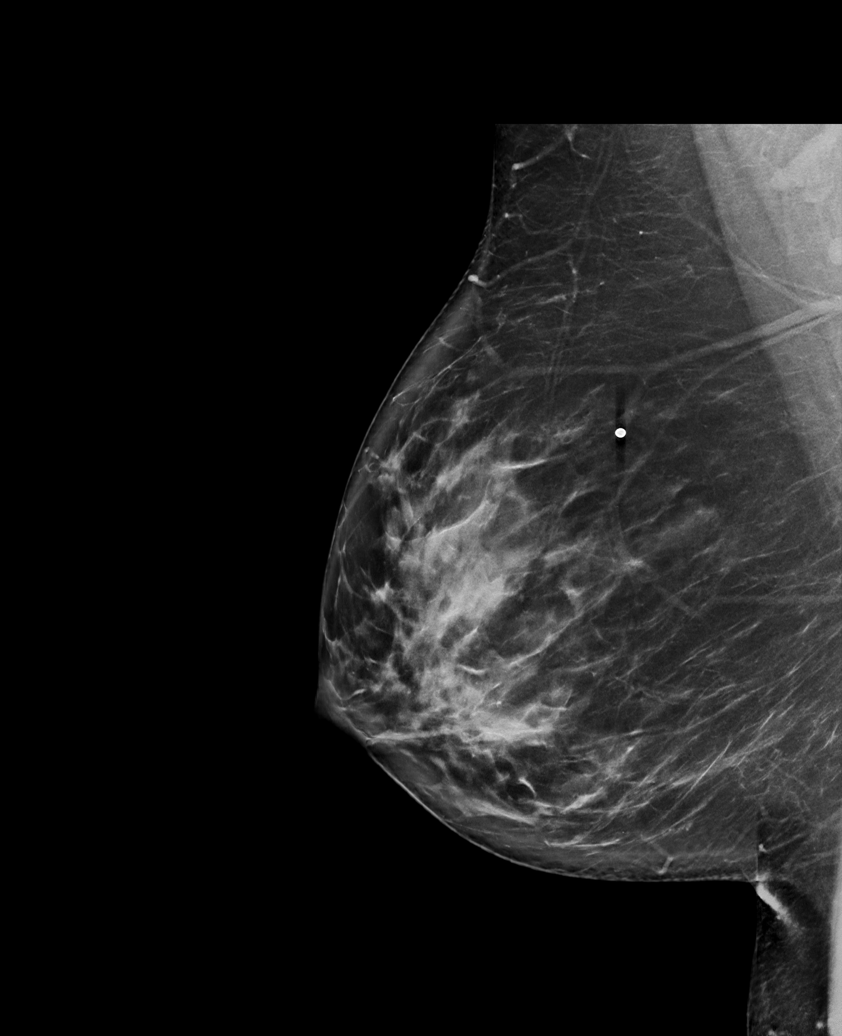

[R CC synth-2D (1 of 2)]
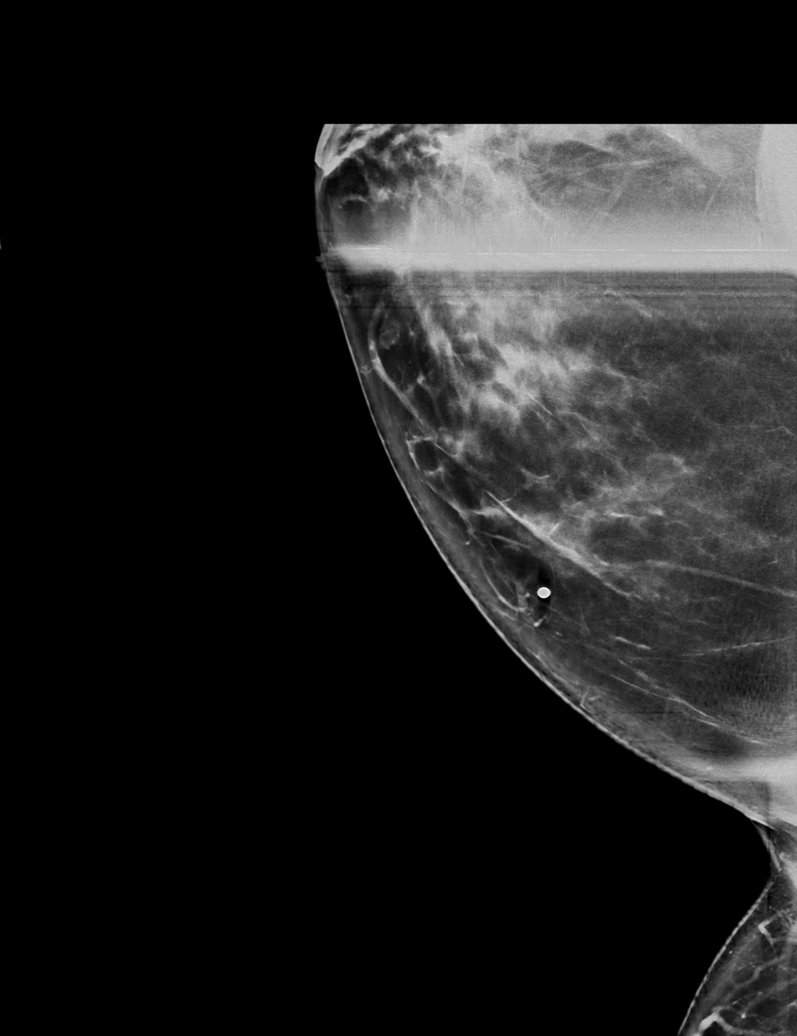

[L MLO synth-2D]
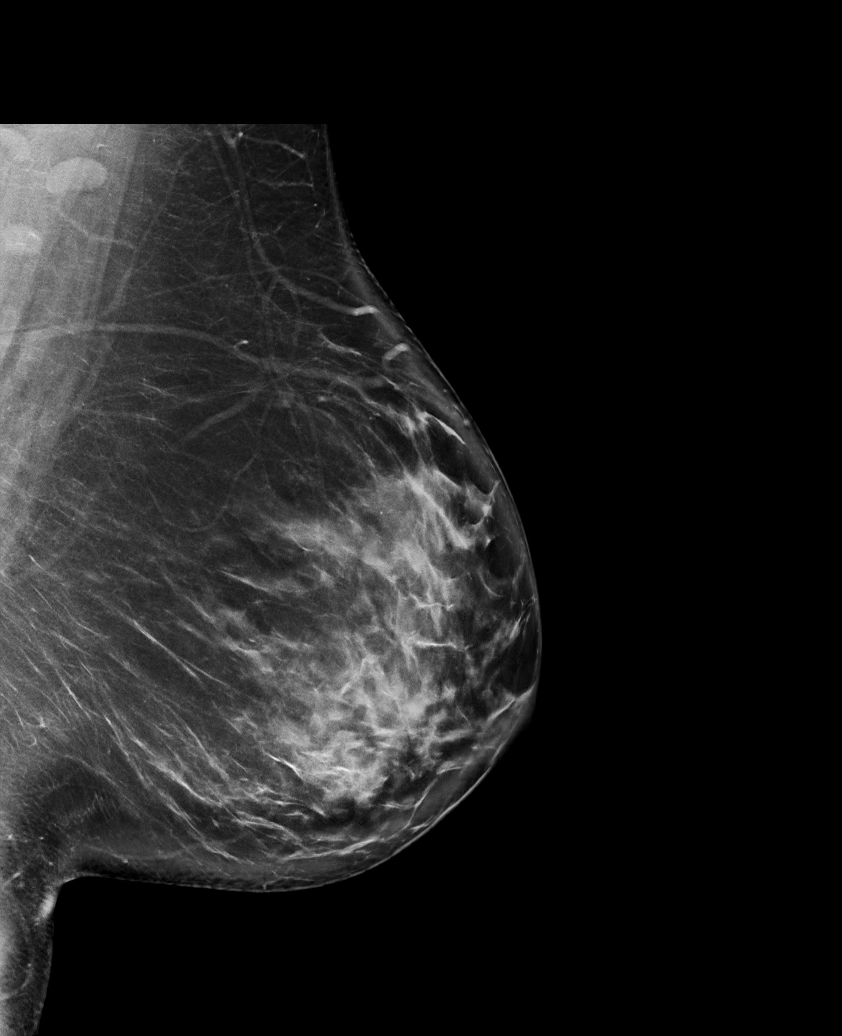

[R CC synth-2D (2 of 2)]
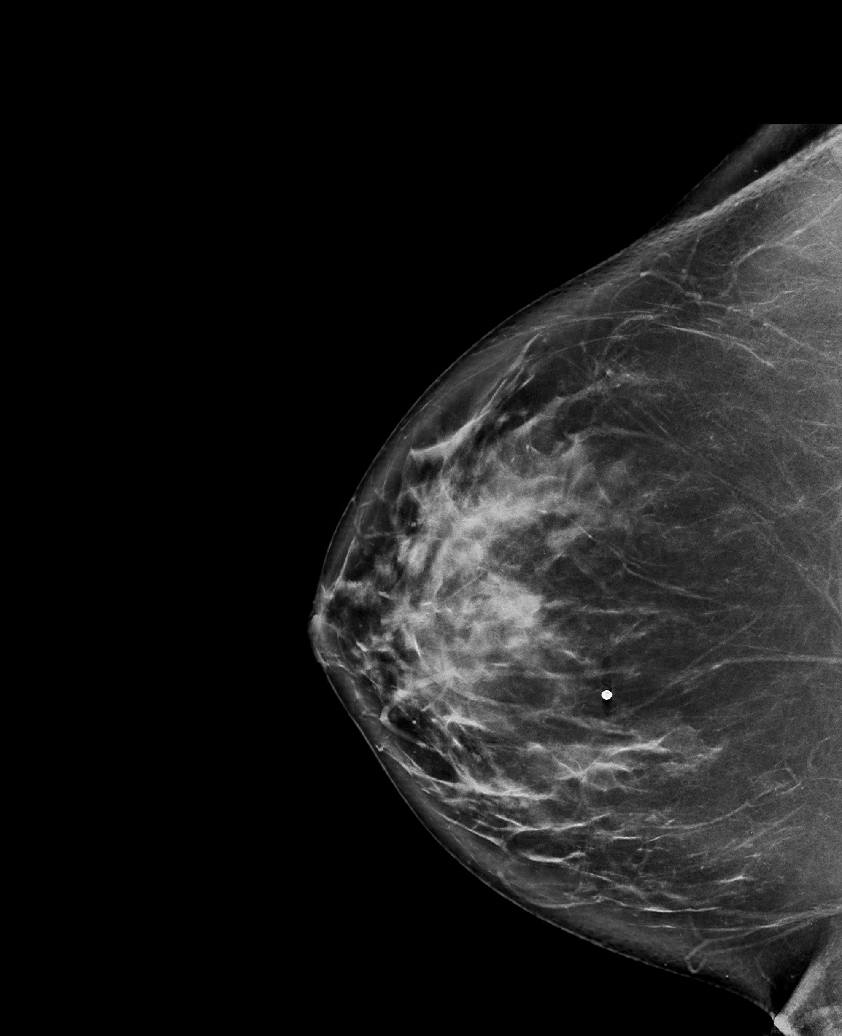

[L CC synth-2D]
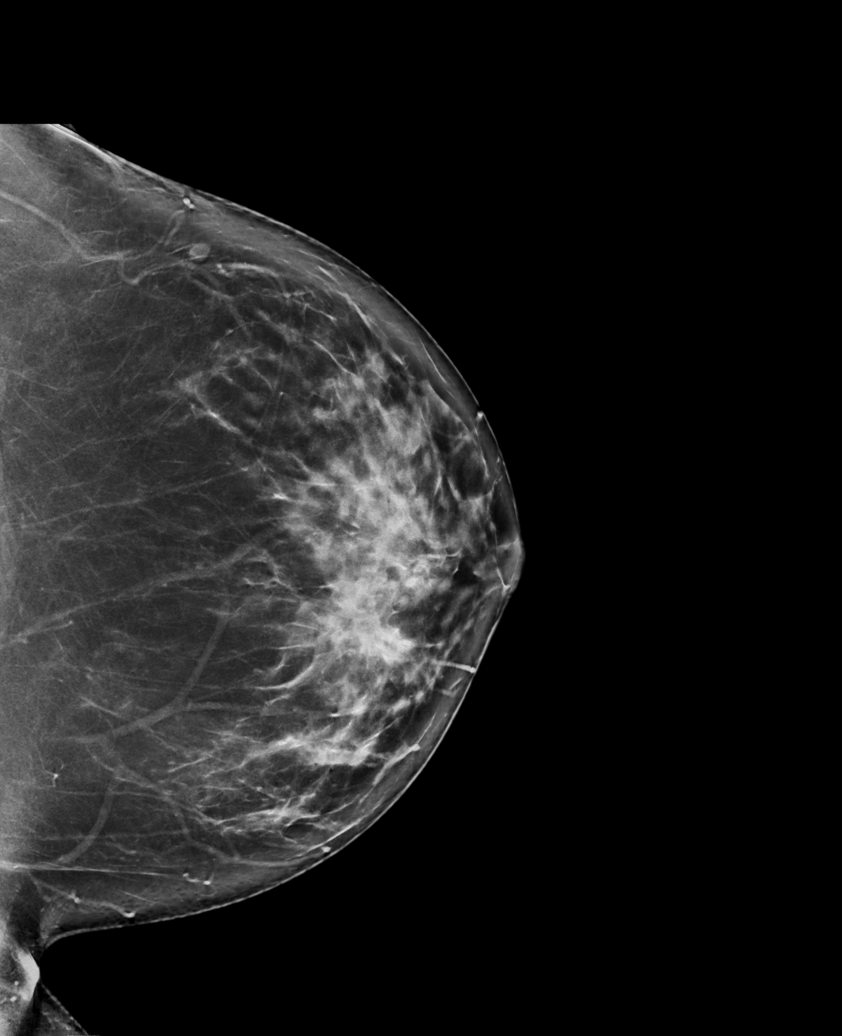

[L MLO tomo · tomo slice 50/99.0]
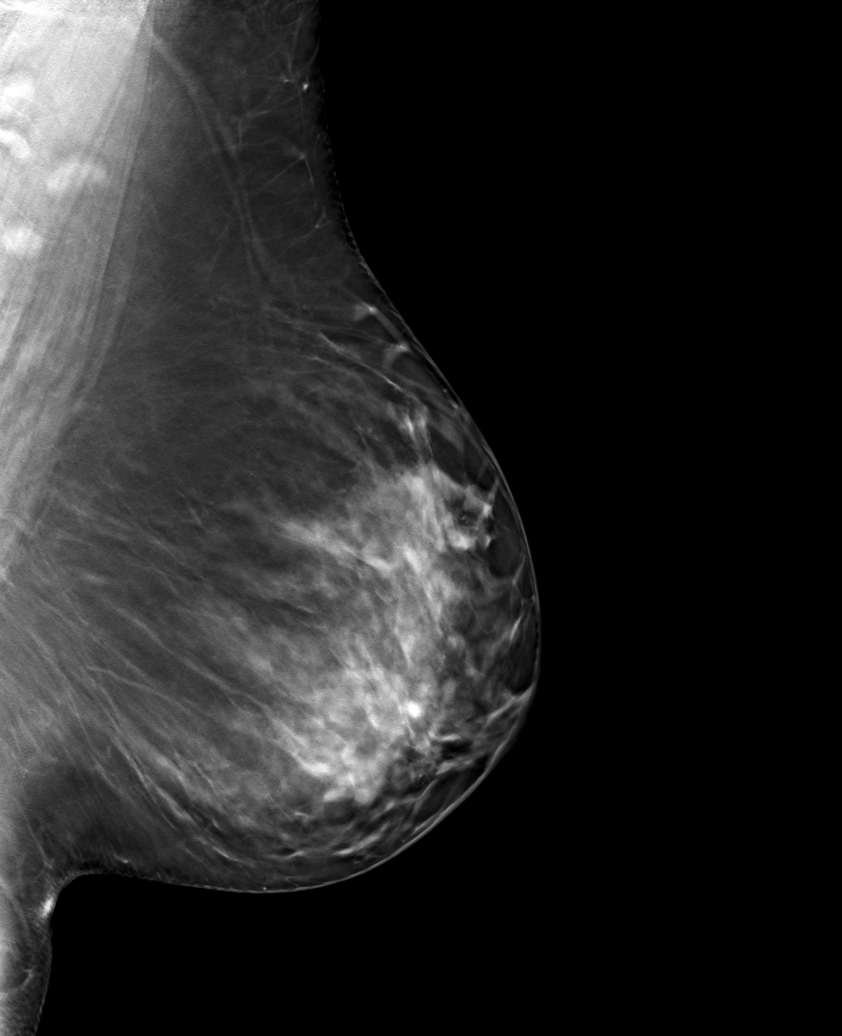

[6 of 30 positions shown; findings below may reference images not displayed]

ACR Breast Density Category c: The breast tissue is heterogeneously
dense, which may obscure small masses.
FINDINGS: A radiopaque BB was placed at the site of the patient's palpable
lump in the upper right breast. No focal or suspicious mammographic
findings are seen deep to the radiopaque BB or within the remainder
of either breast.

Targeted ultrasound is performed, showing normal fibroglandular
tissue without focal or suspicious sonographic abnormality.
Evaluation along the 1 o'clock axis of the right breast was
performed.
IMPRESSION: 1. No mammographic evidence of malignancy in either breast.
2. No suspicious sonographic findings at the site of the patient's
right breast palpable lump.

RECOMMENDATION:
1. Clinical follow-up recommended for the palpable area of concern
in the right breast. Any further workup should be based on clinical
grounds.
2. Screening mammogram at age 38 unless there are persistent or
intervening clinical concerns. (Code:T5-K-1OG)

I have discussed the findings and recommendations with the patient.
If applicable, a reminder letter will be sent to the patient
regarding the next appointment.

BI-RADS CATEGORY  1: Negative.

## 2021-09-26 IMAGING — US US BREAST*R* LIMITED INC AXILLA
1 series · 4 of 4 positions shown · non-contrast
Comparison: None.

CLINICAL DATA: 30-year-old female with a physician palpated right
breast lump.

EXAM:
DIGITAL DIAGNOSTIC BILATERAL MAMMOGRAM WITH TOMOSYNTHESIS AND CAD;
ULTRASOUND RIGHT BREAST LIMITED
TECHNIQUE: Bilateral digital diagnostic mammography and breast tomosynthesis
was performed. The images were evaluated with computer-aided
detection.; Targeted ultrasound examination of the right breast was
performed

[Series 1: us breast*right* limited inc axilla · 0.07mm/px · 4 of 4 slices shown]
[im 1/4]
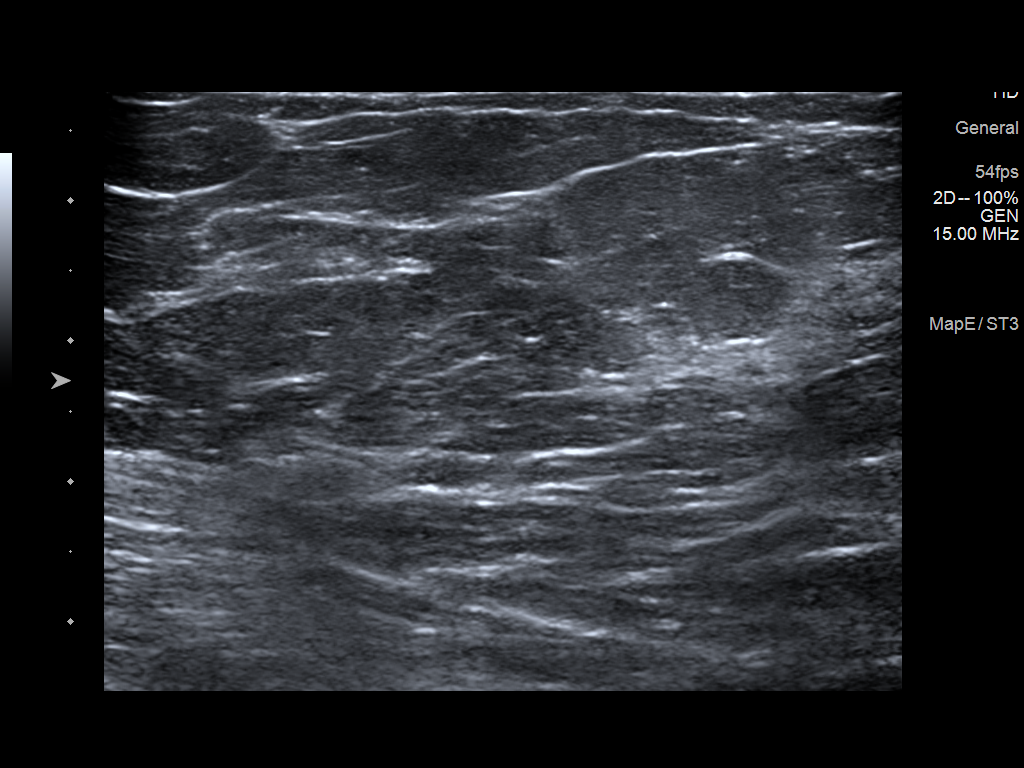
[im 2/4]
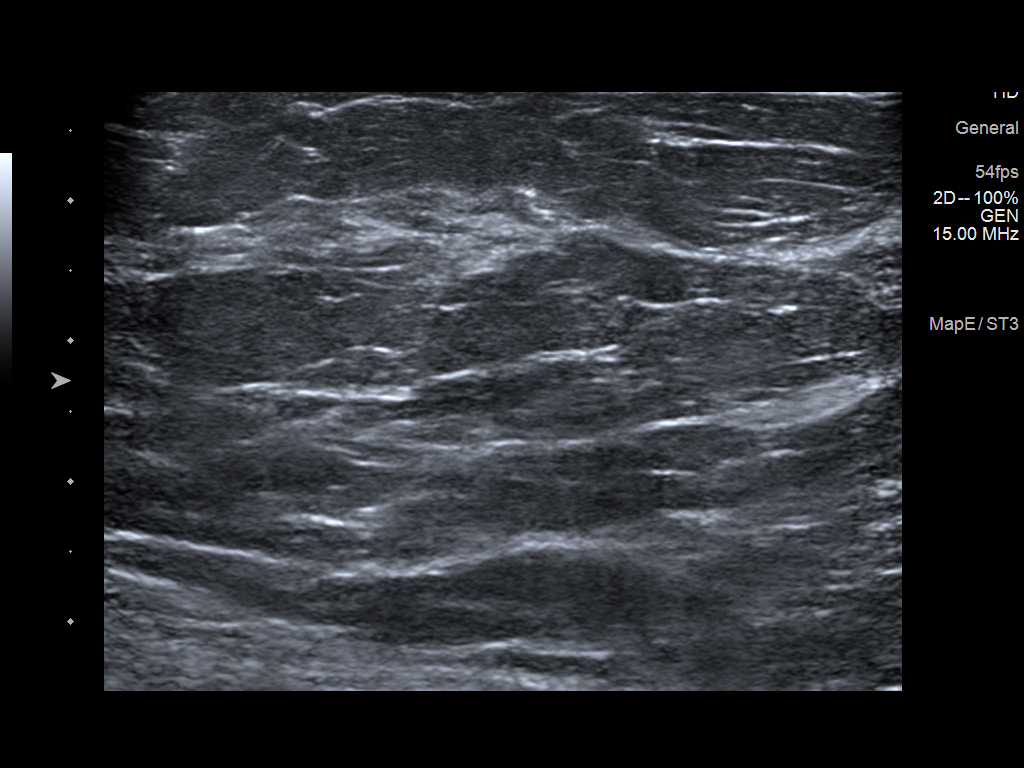
[im 3/4]
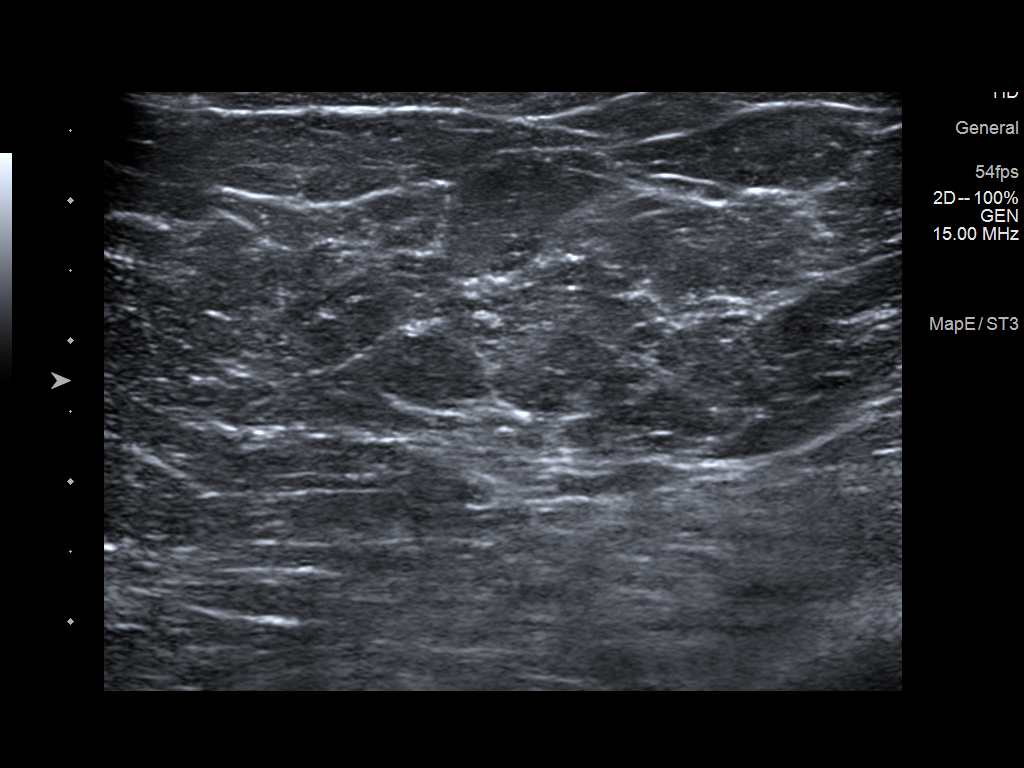
[im 4/4]
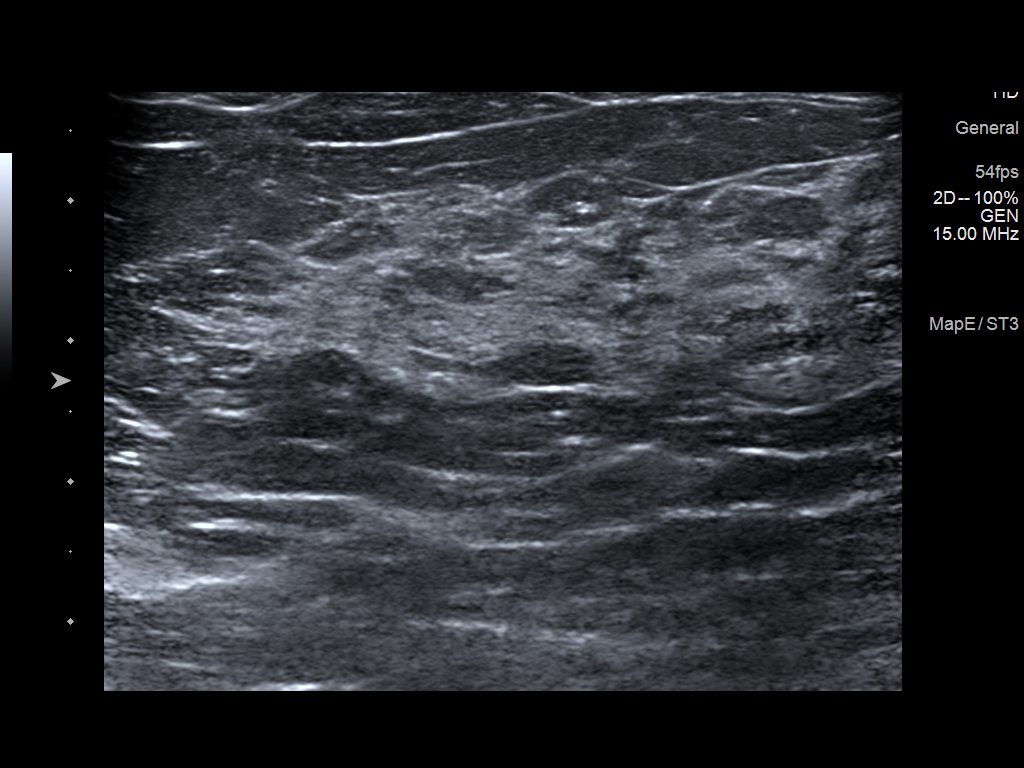

[4 of 4 positions shown; findings below may reference images not displayed]

ACR Breast Density Category c: The breast tissue is heterogeneously
dense, which may obscure small masses.
FINDINGS: A radiopaque BB was placed at the site of the patient's palpable
lump in the upper right breast. No focal or suspicious mammographic
findings are seen deep to the radiopaque BB or within the remainder
of either breast.

Targeted ultrasound is performed, showing normal fibroglandular
tissue without focal or suspicious sonographic abnormality.
Evaluation along the 1 o'clock axis of the right breast was
performed.
IMPRESSION: 1. No mammographic evidence of malignancy in either breast.
2. No suspicious sonographic findings at the site of the patient's
right breast palpable lump.

RECOMMENDATION:
1. Clinical follow-up recommended for the palpable area of concern
in the right breast. Any further workup should be based on clinical
grounds.
2. Screening mammogram at age 38 unless there are persistent or
intervening clinical concerns. (Code:T5-K-1OG)

I have discussed the findings and recommendations with the patient.
If applicable, a reminder letter will be sent to the patient
regarding the next appointment.

BI-RADS CATEGORY  1: Negative.

## 2021-09-30 ENCOUNTER — Ambulatory Visit: Payer: BC Managed Care – PPO | Admitting: Podiatry

## 2021-12-03 DIAGNOSIS — Z3042 Encounter for surveillance of injectable contraceptive: Secondary | ICD-10-CM | POA: Diagnosis not present

## 2021-12-14 DIAGNOSIS — J069 Acute upper respiratory infection, unspecified: Secondary | ICD-10-CM | POA: Diagnosis not present

## 2021-12-14 DIAGNOSIS — Z20822 Contact with and (suspected) exposure to covid-19: Secondary | ICD-10-CM | POA: Diagnosis not present

## 2022-01-06 ENCOUNTER — Ambulatory Visit (INDEPENDENT_AMBULATORY_CARE_PROVIDER_SITE_OTHER): Payer: BC Managed Care – PPO | Admitting: Podiatry

## 2022-01-06 DIAGNOSIS — M722 Plantar fascial fibromatosis: Secondary | ICD-10-CM

## 2022-01-08 NOTE — Progress Notes (Signed)
  Subjective:  Patient ID: Carolyn Morris, female    DOB: 1990/08/23,  MRN: [id number]  Chief Complaint  Patient presents with   Plantar Fasciitis    31 y.o. female presents with the above complaint.  Patient presents for follow-up plan fasciitis.  She states she is doing better in terms of Planter fasciitis pain however is not completely gone.  She states she still having some issues pain scale 7 out of 10 she would like [place] to think about surgery.  Review of Systems: Negative except as noted in the HPI. Denies N/V/F/Ch.  No past medical history on file.  Current Outpatient Medications:    albuterol (VENTOLIN HFA) 108 (90 Base) MCG/ACT inhaler, Inhale 2 puffs into the lungs every 6 (six) hours as needed for wheezing or shortness of breath., Disp: 8 g, Rfl: 0   azithromycin (ZITHROMAX) 250 MG tablet, Take 2 tablets by mouth on day 1. Then take 1 tablet by mouth once daily x 4 days., Disp: 6 tablet, Rfl: 0   benzonatate (TESSALON) 100 MG capsule, Take 1 capsule (100 mg total) by mouth 2 (two) times daily as needed for cough., Disp: 20 capsule, Rfl: 0   fluticasone (FLONASE) 50 MCG/ACT nasal spray, Place 2 sprays into both nostrils daily., Disp: 9.9 mL, Rfl: 0   naproxen (NAPROSYN) 500 MG tablet, Take 1 tablet (500 mg total) by mouth 2 (two) times daily with a meal., Disp: 60 tablet, Rfl: 1   predniSONE (DELTASONE) 20 MG tablet, Take 2 tablets (40 mg total) by mouth daily with breakfast., Disp: 10 tablet, Rfl: 0  Social History   Tobacco Use  Smoking Status Never  Smokeless Tobacco Never    No Known Allergies Objective:  There were no vitals filed for this visit. There is no height or weight on file to calculate BMI. Constitutional Well developed. Well nourished.  Vascular Dorsalis pedis pulses palpable bilaterally. Posterior tibial pulses palpable bilaterally. Capillary refill normal to all digits.  No cyanosis or clubbing noted. Pedal hair growth normal.  Neurologic Normal  speech. Oriented to person, place, and time. Epicritic sensation to light touch grossly present bilaterally.  Dermatologic Nails well groomed and normal in appearance. No open wounds. No skin lesions.  Orthopedic: Normal joint ROM without pain or crepitus bilaterally. No visible deformities. Tender to palpation at the calcaneal tuber left. No pain with calcaneal squeeze left. Ankle ROM full range of motion left. Silfverskiold Test: negative left.   Radiographs: Taken and reviewed. No acute fractures or dislocations. No evidence of stress fracture.  Plantar heel spur absent. Posterior heel spur absent.   Assessment:   No diagnosis found.   Plan:  Patient was evaluated and treated and all questions answered.  Plantar Fasciitis, left~recurrence with underlying antalgic gait - XR reviewed as above.  - Educated on icing and stretching. Instructions given.  - Injection delivered to the plantar fascia as below. - DME: Plantar Fascial Brace - Pharmacologic management: None -Continue physical therapy as needed.  I discussed surgical options at this time.  She will reach out to me when she is ready to undergo surgery.  For now we will do local wound care  Procedure: Injection Tendon/Ligament Location: Left plantar fascia at the glabrous junction; medial approach. Skin Prep: alcohol Injectate: 0.5 cc 0.5% marcaine plain, 0.5 cc of 1% Lidocaine, 0.5 cc kenalog 10. Disposition: Patient tolerated procedure well. Injection site dressed with a band-aid.  No follow-ups on file.

## 2022-01-21 ENCOUNTER — Ambulatory Visit: Payer: BC Managed Care – PPO | Admitting: Podiatry

## 2022-01-21 DIAGNOSIS — L309 Dermatitis, unspecified: Secondary | ICD-10-CM | POA: Diagnosis not present

## 2022-01-21 DIAGNOSIS — R3915 Urgency of urination: Secondary | ICD-10-CM | POA: Diagnosis not present

## 2022-01-21 DIAGNOSIS — Z01419 Encounter for gynecological examination (general) (routine) without abnormal findings: Secondary | ICD-10-CM | POA: Diagnosis not present

## 2022-01-27 ENCOUNTER — Ambulatory Visit (INDEPENDENT_AMBULATORY_CARE_PROVIDER_SITE_OTHER): Payer: BC Managed Care – PPO | Admitting: Podiatry

## 2022-01-27 ENCOUNTER — Encounter: Payer: Self-pay | Admitting: Podiatry

## 2022-01-27 DIAGNOSIS — M722 Plantar fascial fibromatosis: Secondary | ICD-10-CM | POA: Diagnosis not present

## 2022-01-27 DIAGNOSIS — Z01818 Encounter for other preprocedural examination: Secondary | ICD-10-CM

## 2022-01-27 DIAGNOSIS — M21862 Other specified acquired deformities of left lower leg: Secondary | ICD-10-CM | POA: Diagnosis not present

## 2022-01-27 NOTE — Progress Notes (Signed)
Subjective:  Patient ID: Carolyn Morris, female    DOB: [birth year],  MRN: [id number]  Chief Complaint  Patient presents with   Foot Pain    "We're going to set up surgery."    31 y.o. female presents with the above complaint.  Patient presents with follow-up of Planter fasciitis.  She states that the pain is about the same.  She states it continues to hurt she is here to undergo surgery as she has failed all conservative treatment options.  Pain scale 7 out of 10.  Review of Systems: Negative except as noted in the HPI. Denies N/V/F/Ch.  No past medical history on file.  Current Outpatient Medications:    albuterol (VENTOLIN HFA) 108 (90 Base) MCG/ACT inhaler, Inhale 2 puffs into the lungs every 6 (six) hours as needed for wheezing or shortness of breath., Disp: 8 g, Rfl: 0   azithromycin (ZITHROMAX) 250 MG tablet, Take 2 tablets by mouth on day 1. Then take 1 tablet by mouth once daily x 4 days., Disp: 6 tablet, Rfl: 0   benzonatate (TESSALON) 100 MG capsule, Take 1 capsule (100 mg total) by mouth 2 (two) times daily as needed for cough., Disp: 20 capsule, Rfl: 0   fluticasone (FLONASE) 50 MCG/ACT nasal spray, Place 2 sprays into both nostrils daily., Disp: 9.9 mL, Rfl: 0   naproxen (NAPROSYN) 500 MG tablet, Take 1 tablet (500 mg total) by mouth 2 (two) times daily with a meal., Disp: 60 tablet, Rfl: 1   predniSONE (DELTASONE) 20 MG tablet, Take 2 tablets (40 mg total) by mouth daily with breakfast., Disp: 10 tablet, Rfl: 0  Social History   Tobacco Use  Smoking Status Never  Smokeless Tobacco Never    No Known Allergies Objective:  There were no vitals filed for this visit. There is no height or weight on file to calculate BMI. Constitutional Well developed. Well nourished.  Vascular Dorsalis pedis pulses palpable bilaterally. Posterior tibial pulses palpable bilaterally. Capillary refill normal to all digits.  No cyanosis or clubbing noted. Pedal hair growth normal.   Neurologic Normal speech. Oriented to person, place, and time. Epicritic sensation to light touch grossly present bilaterally.  Dermatologic Nails well groomed and normal in appearance. No open wounds. No skin lesions.  Orthopedic: Normal joint ROM without pain or crepitus bilaterally. No visible deformities. Tender to palpation at the calcaneal tuber left. No pain with calcaneal squeeze left. Ankle ROM full range of motion left. Silfverskiold Test: Positive Silfverskiold test gastrocnemius equinus   Radiographs: Taken and reviewed. No acute fractures or dislocations. No evidence of stress fracture.  Plantar heel spur absent. Posterior heel spur absent.   Assessment:   1. Plantar fasciitis of left foot   2. Gastrocnemius equinus, left   3. Encounter for preoperative examination for general surgical procedure      Plan:  Patient was evaluated and treated and all questions answered.  Plantar Fasciitis, left~recurrence with underlying antalgic gait -All questions and concerns were discussed.  Given that she has failed all conservative treatment options including including physical therapy injection and immobilization I believe patient will benefit from surgical intervention.  I discussed endoscopic plantar fasciotomy with gastrocnemius recession.  I discussed my preoperative intra postop plan extensive detail she states understand like to proceed with surgery -Informed surgical risk consent was reviewed and read aloud to the patient.  I reviewed the films.  I have discussed my findings with the patient in great detail.  I have discussed all risks including  but not limited to infection, stiffness, scarring, limp, disability, deformity, damage to blood vessels and nerves, numbness, poor healing, need for braces, arthritis, chronic pain, amputation, death.  All benefits and realistic expectations discussed in great detail.  I have made no promises as to the outcome.  I have provided realistic  expectations.  I have offered the patient a 2nd opinion, which they have declined and assured me they preferred to proceed despite the risks   No follow-ups on file.

## 2022-02-09 ENCOUNTER — Telehealth: Payer: Self-pay | Admitting: Urology

## 2022-02-09 NOTE — Telephone Encounter (Signed)
DOS - 03/02/22  EPF LEFT --- 98921 GASTROCNEMIUS RECESS LEFT --- 19417  [employer] EFFECTIVE DATE - 07/13/21  PLAN DEDUCTIBLE - $1,500.00 W/ $0.00 REMAINING OUT OF POCKET - $4,000.00 W/ $2,431.00 REMAINING COINSURANCE - 20% COPAY -  $0.00   SPOKE WITH AURORE WITH [employer] AND SHE STATED THAT FOR CPT CODES 40814 AND [phone] NO PRIOR AUTH IS REQUIRED.   REF # [id number].

## 2022-03-02 ENCOUNTER — Other Ambulatory Visit: Payer: Self-pay | Admitting: Podiatry

## 2022-03-02 ENCOUNTER — Encounter: Payer: Self-pay | Admitting: Podiatry

## 2022-03-02 DIAGNOSIS — M722 Plantar fascial fibromatosis: Secondary | ICD-10-CM | POA: Diagnosis not present

## 2022-03-02 DIAGNOSIS — M216X2 Other acquired deformities of left foot: Secondary | ICD-10-CM | POA: Diagnosis not present

## 2022-03-02 MED ORDER — OXYCODONE-ACETAMINOPHEN 5-325 MG PO TABS: 1.0000 | ORAL_TABLET | ORAL | 0 refills | Status: DC | PRN

## 2022-03-02 MED ORDER — IBUPROFEN 800 MG PO TABS: 800.0000 mg | ORAL_TABLET | Freq: Four times a day (QID) | ORAL | 1 refills | Status: DC | PRN

## 2022-03-10 ENCOUNTER — Ambulatory Visit (INDEPENDENT_AMBULATORY_CARE_PROVIDER_SITE_OTHER): Payer: BC Managed Care – PPO | Admitting: Podiatry

## 2022-03-10 DIAGNOSIS — M722 Plantar fascial fibromatosis: Secondary | ICD-10-CM

## 2022-03-10 DIAGNOSIS — M21862 Other specified acquired deformities of left lower leg: Secondary | ICD-10-CM

## 2022-03-10 DIAGNOSIS — Z9889 Other specified postprocedural states: Secondary | ICD-10-CM

## 2022-03-10 NOTE — Progress Notes (Signed)
  Subjective:  Patient ID: Carolyn Morris, female    DOB: 10-01-1990,  MRN: [id number]  Chief Complaint  Patient presents with   Routine Post Op    POV #1 DOS 03/02/2022 EPF LT W/GASTROCNEMIUS RECESSION    DOS: 03/02/2022 Procedure: Left EPF and gastrocnemius recession  31 y.o. female returns for post-op check.  Patient states she is doing well minimal pain.  She is weightbearing as tolerated cam boot.  She denies any other acute complaints.  Bandages clean dry and intact  Review of Systems: Negative except as noted in the HPI. Denies N/V/F/Ch.  No past medical history on file.  Current Outpatient Medications:    albuterol (VENTOLIN HFA) 108 (90 Base) MCG/ACT inhaler, Inhale 2 puffs into the lungs every 6 (six) hours as needed for wheezing or shortness of breath., Disp: 8 g, Rfl: 0   azithromycin (ZITHROMAX) 250 MG tablet, Take 2 tablets by mouth on day 1. Then take 1 tablet by mouth once daily x 4 days., Disp: 6 tablet, Rfl: 0   benzonatate (TESSALON) 100 MG capsule, Take 1 capsule (100 mg total) by mouth 2 (two) times daily as needed for cough., Disp: 20 capsule, Rfl: 0   fluticasone (FLONASE) 50 MCG/ACT nasal spray, Place 2 sprays into both nostrils daily., Disp: 9.9 mL, Rfl: 0   ibuprofen (ADVIL) 800 MG tablet, Take 1 tablet (800 mg total) by mouth every 6 (six) hours as needed., Disp: 60 tablet, Rfl: 1   naproxen (NAPROSYN) 500 MG tablet, Take 1 tablet (500 mg total) by mouth 2 (two) times daily with a meal., Disp: 60 tablet, Rfl: 1   oxyCODONE-acetaminophen (PERCOCET) 5-325 MG tablet, Take 1 tablet by mouth every 4 (four) hours as needed for severe pain., Disp: 30 tablet, Rfl: 0   predniSONE (DELTASONE) 20 MG tablet, Take 2 tablets (40 mg total) by mouth daily with breakfast., Disp: 10 tablet, Rfl: 0  Social History   Tobacco Use  Smoking Status Never  Smokeless Tobacco Never    No Known Allergies Objective:  There were no vitals filed for this visit. There is no height  or weight on file to calculate BMI. Constitutional Well developed. Well nourished.  Vascular Foot warm and well perfused. Capillary refill normal to all digits.   Neurologic Normal speech. Oriented to person, place, and time. Epicritic sensation to light touch grossly present bilaterally.  Dermatologic Skin healing well without signs of infection. Skin edges well coapted without signs of infection.  Orthopedic: Tenderness to palpation noted about the surgical site.   Radiographs: None Assessment:   1. Plantar fasciitis of left foot   2. Gastrocnemius equinus, left   3. Status post foot surgery    Plan:  Patient was evaluated and treated and all questions answered.  S/p foot surgery left -Progressing as expected post-operatively. -XR: See above -WB Status: Weightbearing as tolerated in cam boot -Sutures: Intact.  No clinical signs of Deis is noted.  No complication noted. -Medications: None -Foot redressed.  No follow-ups on file.

## 2022-03-24 ENCOUNTER — Encounter: Payer: Self-pay | Admitting: Podiatry

## 2022-03-24 ENCOUNTER — Ambulatory Visit (INDEPENDENT_AMBULATORY_CARE_PROVIDER_SITE_OTHER): Payer: BC Managed Care – PPO | Admitting: Podiatry

## 2022-03-24 DIAGNOSIS — M722 Plantar fascial fibromatosis: Secondary | ICD-10-CM

## 2022-03-24 DIAGNOSIS — Z9889 Other specified postprocedural states: Secondary | ICD-10-CM

## 2022-03-24 DIAGNOSIS — M21862 Other specified acquired deformities of left lower leg: Secondary | ICD-10-CM

## 2022-03-24 NOTE — Progress Notes (Signed)
  Subjective:  Patient ID: Carolyn Morris, female    DOB: [birth year],  MRN: [id number]  Chief Complaint  Patient presents with   Routine Post Op    POV #2 DOS 03/02/2022 EPF LT W/GASTROCNEMIUS RECESSION    DOS: 03/02/2022 Procedure: Left EPF and gastrocnemius recession  31 y.o. female returns for post-op check.  Patient doing well no pain she has been able to ambulate with cam boot and has tried going to regular shoes without any issues.  Review of Systems: Negative except as noted in the HPI. Denies N/V/F/Ch.  History reviewed. No pertinent past medical history.  Current Outpatient Medications:    albuterol (VENTOLIN HFA) 108 (90 Base) MCG/ACT inhaler, Inhale 2 puffs into the lungs every 6 (six) hours as needed for wheezing or shortness of breath., Disp: 8 g, Rfl: 0   azithromycin (ZITHROMAX) 250 MG tablet, Take 2 tablets by mouth on day 1. Then take 1 tablet by mouth once daily x 4 days., Disp: 6 tablet, Rfl: 0   benzonatate (TESSALON) 100 MG capsule, Take 1 capsule (100 mg total) by mouth 2 (two) times daily as needed for cough., Disp: 20 capsule, Rfl: 0   cetirizine (ZYRTEC) 10 MG tablet, Take 10 mg by mouth daily., Disp: , Rfl:    clotrimazole-betamethasone (LOTRISONE) cream, , Disp: , Rfl:    fluticasone (FLONASE) 50 MCG/ACT nasal spray, Place 2 sprays into both nostrils daily., Disp: 9.9 mL, Rfl: 0   ibuprofen (ADVIL) 800 MG tablet, Take 1 tablet (800 mg total) by mouth every 6 (six) hours as needed., Disp: 60 tablet, Rfl: 1   medroxyPROGESTERone Acetate 150 MG/ML SUSY, Inject 1 mL into the muscle every 3 (three) months., Disp: , Rfl:    naproxen (NAPROSYN) 500 MG tablet, Take 1 tablet (500 mg total) by mouth 2 (two) times daily with a meal., Disp: 60 tablet, Rfl: 1   oxyCODONE-acetaminophen (PERCOCET) 5-325 MG tablet, Take 1 tablet by mouth every 4 (four) hours as needed for severe pain., Disp: 30 tablet, Rfl: 0   predniSONE (DELTASONE) 20 MG tablet, Take 2 tablets (40 mg  total) by mouth daily with breakfast., Disp: 10 tablet, Rfl: 0  Social History   Tobacco Use  Smoking Status Never  Smokeless Tobacco Never    No Known Allergies Objective:  There were no vitals filed for this visit. There is no height or weight on file to calculate BMI. Constitutional Well developed. Well nourished.  Vascular Foot warm and well perfused. Capillary refill normal to all digits.   Neurologic Normal speech. Oriented to person, place, and time. Epicritic sensation to light touch grossly present bilaterally.  Dermatologic Skin completely epithelialized.  No signs of dehiscence noted.  Good range of motion noted at the ankle joint 5 degrees past neutral.  Orthopedic: No tenderness to palpation noted about the surgical site.   Radiographs: None Assessment:   No diagnosis found.  Plan:  Patient was evaluated and treated and all questions answered.  S/p foot surgery left -Progressing as expected post-operatively. -XR: See above -WB Status: Weightbearing as tolerated in regular shoes -Sutures: Removed no clinical signs of Deis is noted.  No complication noted. -Medications: None Is officially discharged from my care if any foot and ankle issues arise in the future of asked her to come back and see me.  She will gradually return back to work  No follow-ups on file.

## 2022-04-16 ENCOUNTER — Telehealth: Payer: Self-pay

## 2022-04-16 NOTE — Telephone Encounter (Signed)
Patient would like to know what should she do when it flares up. She has tried soaking her foot.

## 2022-04-16 NOTE — Telephone Encounter (Signed)
Patient states her orthotics were made before her surgery and is wondering if they may need to be adjusted as well and if that could be the cause of the knot.

## 2022-04-22 DIAGNOSIS — Z3041 Encounter for surveillance of contraceptive pills: Secondary | ICD-10-CM | POA: Diagnosis not present

## 2022-04-22 DIAGNOSIS — L83 Acanthosis nigricans: Secondary | ICD-10-CM | POA: Diagnosis not present

## 2022-04-22 DIAGNOSIS — L859 Epidermal thickening, unspecified: Secondary | ICD-10-CM | POA: Diagnosis not present

## 2022-04-22 DIAGNOSIS — N9089 Other specified noninflammatory disorders of vulva and perineum: Secondary | ICD-10-CM | POA: Diagnosis not present

## 2022-04-22 DIAGNOSIS — N898 Other specified noninflammatory disorders of vagina: Secondary | ICD-10-CM | POA: Diagnosis not present

## 2022-04-28 ENCOUNTER — Ambulatory Visit (INDEPENDENT_AMBULATORY_CARE_PROVIDER_SITE_OTHER): Payer: BC Managed Care – PPO | Admitting: Podiatry

## 2022-04-28 DIAGNOSIS — Z9889 Other specified postprocedural states: Secondary | ICD-10-CM

## 2022-04-28 DIAGNOSIS — M722 Plantar fascial fibromatosis: Secondary | ICD-10-CM

## 2022-04-28 DIAGNOSIS — M62462 Contracture of muscle, left lower leg: Secondary | ICD-10-CM

## 2022-04-28 MED ORDER — MELOXICAM 15 MG PO TABS: 15.0000 mg | ORAL_TABLET | Freq: Every day | ORAL | 0 refills | Status: DC

## 2022-05-05 NOTE — Progress Notes (Signed)
  Subjective:  Patient ID: Carolyn Morris, female    DOB: Dec 15, 1990,  MRN: [id number]  Chief Complaint  Patient presents with   Plantar Fasciitis    DOS: 03/02/2022 Procedure: Left EPF and gastrocnemius recession  31 y.o. female returns for post-op check.  She states she started to have some pain again.  She does long hours on her foot.  She wears orthotics.  She wanted discuss other treatment options  Review of Systems: Negative except as noted in the HPI. Denies N/V/F/Ch.  No past medical history on file.  Current Outpatient Medications:    meloxicam (MOBIC) 15 MG tablet, Take 1 tablet (15 mg total) by mouth daily., Disp: 30 tablet, Rfl: 0   albuterol (VENTOLIN HFA) 108 (90 Base) MCG/ACT inhaler, Inhale 2 puffs into the lungs every 6 (six) hours as needed for wheezing or shortness of breath., Disp: 8 g, Rfl: 0   azithromycin (ZITHROMAX) 250 MG tablet, Take 2 tablets by mouth on day 1. Then take 1 tablet by mouth once daily x 4 days., Disp: 6 tablet, Rfl: 0   benzonatate (TESSALON) 100 MG capsule, Take 1 capsule (100 mg total) by mouth 2 (two) times daily as needed for cough., Disp: 20 capsule, Rfl: 0   cetirizine (ZYRTEC) 10 MG tablet, Take 10 mg by mouth daily., Disp: , Rfl:    clotrimazole-betamethasone (LOTRISONE) cream, , Disp: , Rfl:    fluticasone (FLONASE) 50 MCG/ACT nasal spray, Place 2 sprays into both nostrils daily., Disp: 9.9 mL, Rfl: 0   ibuprofen (ADVIL) 800 MG tablet, Take 1 tablet (800 mg total) by mouth every 6 (six) hours as needed., Disp: 60 tablet, Rfl: 1   medroxyPROGESTERone Acetate 150 MG/ML SUSY, Inject 1 mL into the muscle every 3 (three) months., Disp: , Rfl:    naproxen (NAPROSYN) 500 MG tablet, Take 1 tablet (500 mg total) by mouth 2 (two) times daily with a meal., Disp: 60 tablet, Rfl: 1   oxyCODONE-acetaminophen (PERCOCET) 5-325 MG tablet, Take 1 tablet by mouth every 4 (four) hours as needed for severe pain., Disp: 30 tablet, Rfl: 0   predniSONE  (DELTASONE) 20 MG tablet, Take 2 tablets (40 mg total) by mouth daily with breakfast., Disp: 10 tablet, Rfl: 0  Social History   Tobacco Use  Smoking Status Never  Smokeless Tobacco Never    No Known Allergies Objective:  There were no vitals filed for this visit. There is no height or weight on file to calculate BMI. Constitutional Well developed. Well nourished.  Vascular Foot warm and well perfused. Capillary refill normal to all digits.   Neurologic Normal speech. Oriented to person, place, and time. Epicritic sensation to light touch grossly present bilaterally.  Dermatologic Skin completely epithelialized.  No signs of dehiscence noted.  Good range of motion noted at the ankle joint 5 degrees past neutral.  Orthopedic: Medial side of the calcaneus tenderness to palpation noted about the surgical site.   Radiographs: None Assessment:   No diagnosis found.  Plan:  Patient was evaluated and treated and all questions answered.  S/p foot surgery left -Patient clinically continues to have some pain especially given the amount of work she does on her foot.  At this time patient would benefit from meloxicam.  If her pain does not resolve she will benefit from an injection next time.  She states understanding.  No follow-ups on file.

## 2022-05-11 DIAGNOSIS — L859 Epidermal thickening, unspecified: Secondary | ICD-10-CM | POA: Diagnosis not present

## 2022-05-25 ENCOUNTER — Other Ambulatory Visit: Payer: Self-pay | Admitting: Podiatry

## 2022-05-25 DIAGNOSIS — M722 Plantar fascial fibromatosis: Secondary | ICD-10-CM

## 2022-05-27 NOTE — Telephone Encounter (Signed)
Please advise 

## 2022-06-11 ENCOUNTER — Ambulatory Visit (INDEPENDENT_AMBULATORY_CARE_PROVIDER_SITE_OTHER): Payer: BC Managed Care – PPO | Admitting: Podiatry

## 2022-06-11 DIAGNOSIS — M722 Plantar fascial fibromatosis: Secondary | ICD-10-CM | POA: Diagnosis not present

## 2022-06-16 NOTE — Progress Notes (Signed)
  Subjective:  Patient ID: Carolyn Morris, female    DOB: Nov 14, 1990,  MRN: [id number]  Chief Complaint  Patient presents with   Routine Post Op    DOS: 03/02/2022 Procedure: Left EPF and gastrocnemius recession  31 y.o. female returns for post-op check.  She states she started to have some pain again.  She does long hours on her foot.  She wears orthotics.  She wanted discuss other treatment options  Review of Systems: Negative except as noted in the HPI. Denies N/V/F/Ch.  No past medical history on file.  Current Outpatient Medications:    albuterol (VENTOLIN HFA) 108 (90 Base) MCG/ACT inhaler, Inhale 2 puffs into the lungs every 6 (six) hours as needed for wheezing or shortness of breath., Disp: 8 g, Rfl: 0   azithromycin (ZITHROMAX) 250 MG tablet, Take 2 tablets by mouth on day 1. Then take 1 tablet by mouth once daily x 4 days., Disp: 6 tablet, Rfl: 0   benzonatate (TESSALON) 100 MG capsule, Take 1 capsule (100 mg total) by mouth 2 (two) times daily as needed for cough., Disp: 20 capsule, Rfl: 0   cetirizine (ZYRTEC) 10 MG tablet, Take 10 mg by mouth daily., Disp: , Rfl:    clotrimazole-betamethasone (LOTRISONE) cream, , Disp: , Rfl:    fluticasone (FLONASE) 50 MCG/ACT nasal spray, Place 2 sprays into both nostrils daily., Disp: 9.9 mL, Rfl: 0   ibuprofen (ADVIL) 800 MG tablet, Take 1 tablet (800 mg total) by mouth every 6 (six) hours as needed., Disp: 60 tablet, Rfl: 1   medroxyPROGESTERone Acetate 150 MG/ML SUSY, Inject 1 mL into the muscle every 3 (three) months., Disp: , Rfl:    meloxicam (MOBIC) 15 MG tablet, Take 1 tablet (15 mg total) by mouth daily., Disp: 30 tablet, Rfl: 0   naproxen (NAPROSYN) 500 MG tablet, Take 1 tablet (500 mg total) by mouth 2 (two) times daily with a meal., Disp: 60 tablet, Rfl: 1   oxyCODONE-acetaminophen (PERCOCET) 5-325 MG tablet, Take 1 tablet by mouth every 4 (four) hours as needed for severe pain., Disp: 30 tablet, Rfl: 0   predniSONE  (DELTASONE) 20 MG tablet, Take 2 tablets (40 mg total) by mouth daily with breakfast., Disp: 10 tablet, Rfl: 0  Social History   Tobacco Use  Smoking Status Never  Smokeless Tobacco Never    No Known Allergies Objective:  There were no vitals filed for this visit. There is no height or weight on file to calculate BMI. Constitutional Well developed. Well nourished.  Vascular Foot warm and well perfused. Capillary refill normal to all digits.   Neurologic Normal speech. Oriented to person, place, and time. Epicritic sensation to light touch grossly present bilaterally.  Dermatologic Skin completely epithelialized.  No signs of dehiscence noted.  Good range of motion noted at the ankle joint 5 degrees past neutral.  Orthopedic: Very mild medial side of the calcaneus tenderness to palpation noted about the surgical site.   Radiographs: None Assessment:   No diagnosis found.  Plan:  Patient was evaluated and treated and all questions answered.  S/p foot surgery left -Patient clinically continues to have some pain especially given the amount of work she does on her foot.  Clinically patient can continue managing the pain at this time.  If any foot and ankle issues are in the future of asked her to come back and see me.  No follow-ups on file.

## 2022-07-24 ENCOUNTER — Ambulatory Visit (INDEPENDENT_AMBULATORY_CARE_PROVIDER_SITE_OTHER): Payer: BC Managed Care – PPO | Admitting: Podiatry

## 2022-07-24 DIAGNOSIS — Q667 Congenital pes cavus, unspecified foot: Secondary | ICD-10-CM

## 2022-07-24 MED ORDER — MELOXICAM 15 MG PO TABS: 15.0000 mg | ORAL_TABLET | Freq: Every day | ORAL | 0 refills | Status: DC

## 2022-07-24 NOTE — Progress Notes (Signed)
Subjective:  Patient ID: Carolyn Morris, female    DOB: 01-Feb-1991,  MRN: [id number]  Chief Complaint  Patient presents with   Plantar Fasciitis    Pt stated that she is doing a little better     32 y.o. female presents with the above complaint.  Patient presents with pes cavus and assessment history of status post EPF and gastrocnemius recession to the left side.  She states is doing better.  Is more manageable.  Her orthotics are worn out and she would like to get them refurbished.  Denies any other acute complaints hurts with ambulation hurts with pressure.   Review of Systems: Negative except as noted in the HPI. Denies N/V/F/Ch.  No past medical history on file.  Current Outpatient Medications:    meloxicam (MOBIC) 15 MG tablet, Take 1 tablet (15 mg total) by mouth daily., Disp: 30 tablet, Rfl: 0   albuterol (VENTOLIN HFA) 108 (90 Base) MCG/ACT inhaler, Inhale 2 puffs into the lungs every 6 (six) hours as needed for wheezing or shortness of breath., Disp: 8 g, Rfl: 0   azithromycin (ZITHROMAX) 250 MG tablet, Take 2 tablets by mouth on day 1. Then take 1 tablet by mouth once daily x 4 days., Disp: 6 tablet, Rfl: 0   benzonatate (TESSALON) 100 MG capsule, Take 1 capsule (100 mg total) by mouth 2 (two) times daily as needed for cough., Disp: 20 capsule, Rfl: 0   cetirizine (ZYRTEC) 10 MG tablet, Take 10 mg by mouth daily., Disp: , Rfl:    clotrimazole-betamethasone (LOTRISONE) cream, , Disp: , Rfl:    fluticasone (FLONASE) 50 MCG/ACT nasal spray, Place 2 sprays into both nostrils daily., Disp: 9.9 mL, Rfl: 0   ibuprofen (ADVIL) 800 MG tablet, Take 1 tablet (800 mg total) by mouth every 6 (six) hours as needed., Disp: 60 tablet, Rfl: 1   medroxyPROGESTERone Acetate 150 MG/ML SUSY, Inject 1 mL into the muscle every 3 (three) months., Disp: , Rfl:    meloxicam (MOBIC) 15 MG tablet, TAKE 1 TABLET (15 MG TOTAL) BY MOUTH DAILY., Disp: 30 tablet, Rfl: 0   naproxen (NAPROSYN) 500 MG  tablet, Take 1 tablet (500 mg total) by mouth 2 (two) times daily with a meal., Disp: 60 tablet, Rfl: 1   oxyCODONE-acetaminophen (PERCOCET) 5-325 MG tablet, Take 1 tablet by mouth every 4 (four) hours as needed for severe pain., Disp: 30 tablet, Rfl: 0   predniSONE (DELTASONE) 20 MG tablet, Take 2 tablets (40 mg total) by mouth daily with breakfast., Disp: 10 tablet, Rfl: 0  Social History   Tobacco Use  Smoking Status Never  Smokeless Tobacco Never    No Known Allergies Objective:  There were no vitals filed for this visit. There is no height or weight on file to calculate BMI. Constitutional Well developed. Well nourished.  Vascular Dorsalis pedis pulses palpable bilaterally. Posterior tibial pulses palpable bilaterally. Capillary refill normal to all digits.  No cyanosis or clubbing noted. Pedal hair growth normal.  Neurologic Normal speech. Oriented to person, place, and time. Epicritic sensation to light touch grossly present bilaterally.  Dermatologic Nails well groomed and normal in appearance. No open wounds. No skin lesions.  Orthopedic: Pes cavus foot structure noted with some of flexibility in the arch.  Calcaneal valgus.  Too many toe signs noted.   Radiographs: None Assessment:  No diagnosis found. Plan:  Patient was evaluated and treated and all questions answered.  Pes cavus -I explained to patient the etiology of pes cavus  and relationship with Planter fasciitis and various treatment options were discussed.  Given patient foot structure in the setting of Planter fasciitis I believe patient will benefit from custom-made orthotics to help control the hindfoot motion support the arch of the foot and take the stress away from plantar fascial.  Patient agrees with the plan like to proceed with orthotics -Patient's previous orthotics were sent in for me refurbished.   No follow-ups on file.

## 2022-08-10 ENCOUNTER — Telehealth: Payer: Self-pay | Admitting: Podiatry

## 2022-08-10 NOTE — Telephone Encounter (Signed)
Left message letting patient know her orthotics are in , they are currently in [place] need shipped to [facility] when scheduled  Balance is 90.00

## 2022-08-21 ENCOUNTER — Other Ambulatory Visit: Payer: Self-pay | Admitting: Podiatry

## 2022-08-31 DIAGNOSIS — L503 Dermatographic urticaria: Secondary | ICD-10-CM | POA: Diagnosis not present

## 2022-09-09 DIAGNOSIS — S50861A Insect bite (nonvenomous) of right forearm, initial encounter: Secondary | ICD-10-CM | POA: Diagnosis not present

## 2022-09-18 DIAGNOSIS — L501 Idiopathic urticaria: Secondary | ICD-10-CM | POA: Diagnosis not present

## 2022-10-02 DIAGNOSIS — L501 Idiopathic urticaria: Secondary | ICD-10-CM | POA: Diagnosis not present

## 2022-10-02 DIAGNOSIS — J3089 Other allergic rhinitis: Secondary | ICD-10-CM | POA: Diagnosis not present

## 2022-10-02 DIAGNOSIS — R052 Subacute cough: Secondary | ICD-10-CM | POA: Diagnosis not present

## 2022-11-04 DIAGNOSIS — L501 Idiopathic urticaria: Secondary | ICD-10-CM | POA: Diagnosis not present

## 2022-11-04 DIAGNOSIS — J300 Vasomotor rhinitis: Secondary | ICD-10-CM | POA: Diagnosis not present

## 2022-11-12 DIAGNOSIS — L309 Dermatitis, unspecified: Secondary | ICD-10-CM | POA: Diagnosis not present

## 2022-11-12 DIAGNOSIS — L308 Other specified dermatitis: Secondary | ICD-10-CM | POA: Diagnosis not present

## 2023-01-27 DIAGNOSIS — Z01419 Encounter for gynecological examination (general) (routine) without abnormal findings: Secondary | ICD-10-CM | POA: Diagnosis not present

## 2023-01-27 DIAGNOSIS — R6882 Decreased libido: Secondary | ICD-10-CM | POA: Diagnosis not present

## 2023-01-27 DIAGNOSIS — F528 Other sexual dysfunction not due to a substance or known physiological condition: Secondary | ICD-10-CM | POA: Diagnosis not present

## 2023-03-11 ENCOUNTER — Ambulatory Visit (INDEPENDENT_AMBULATORY_CARE_PROVIDER_SITE_OTHER): Payer: BC Managed Care – PPO | Admitting: Podiatry

## 2023-03-11 DIAGNOSIS — M722 Plantar fascial fibromatosis: Secondary | ICD-10-CM

## 2023-03-11 NOTE — Progress Notes (Signed)
Subjective:  Patient ID: Carolyn Morris, female    DOB: Feb 07, 1991,  MRN: [id number]  Chief Complaint  Patient presents with   Plantar Fasciitis    32 y.o. female presents with the above complaint.  Patient presents with follow-up of left plantar fasciitis.  She states she is doing pain again.  She is having some recurrence of it.  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  Denies any other acute complaints.   Review of Systems: Negative except as noted in the HPI. Denies N/V/F/Ch.  No past medical history on file.  Current Outpatient Medications:    albuterol (VENTOLIN HFA) 108 (90 Base) MCG/ACT inhaler, Inhale 2 puffs into the lungs every 6 (six) hours as needed for wheezing or shortness of breath., Disp: 8 g, Rfl: 0   azithromycin (ZITHROMAX) 250 MG tablet, Take 2 tablets by mouth on day 1. Then take 1 tablet by mouth once daily x 4 days., Disp: 6 tablet, Rfl: 0   benzonatate (TESSALON) 100 MG capsule, Take 1 capsule (100 mg total) by mouth 2 (two) times daily as needed for cough., Disp: 20 capsule, Rfl: 0   cetirizine (ZYRTEC) 10 MG tablet, Take 10 mg by mouth daily., Disp: , Rfl:    clotrimazole-betamethasone (LOTRISONE) cream, , Disp: , Rfl:    fluticasone (FLONASE) 50 MCG/ACT nasal spray, Place 2 sprays into both nostrils daily., Disp: 9.9 mL, Rfl: 0   ibuprofen (ADVIL) 800 MG tablet, Take 1 tablet (800 mg total) by mouth every 6 (six) hours as needed., Disp: 60 tablet, Rfl: 1   medroxyPROGESTERone Acetate 150 MG/ML SUSY, Inject 1 mL into the muscle every 3 (three) months., Disp: , Rfl:    meloxicam (MOBIC) 15 MG tablet, TAKE 1 TABLET (15 MG TOTAL) BY MOUTH DAILY., Disp: 30 tablet, Rfl: 0   meloxicam (MOBIC) 15 MG tablet, TAKE 1 TABLET (15 MG TOTAL) BY MOUTH DAILY., Disp: 30 tablet, Rfl: 0   naproxen (NAPROSYN) 500 MG tablet, Take 1 tablet (500 mg total) by mouth 2 (two) times daily with a meal., Disp: 60 tablet, Rfl: 1    oxyCODONE-acetaminophen (PERCOCET) 5-325 MG tablet, Take 1 tablet by mouth every 4 (four) hours as needed for severe pain., Disp: 30 tablet, Rfl: 0   predniSONE (DELTASONE) 20 MG tablet, Take 2 tablets (40 mg total) by mouth daily with breakfast., Disp: 10 tablet, Rfl: 0  Social History   Tobacco Use  Smoking Status Never  Smokeless Tobacco Never    No Known Allergies Objective:  There were no vitals filed for this visit. There is no height or weight on file to calculate BMI. Constitutional Well developed. Well nourished.  Vascular Dorsalis pedis pulses palpable bilaterally. Posterior tibial pulses palpable bilaterally. Capillary refill normal to all digits.  No cyanosis or clubbing noted. Pedal hair growth normal.  Neurologic Normal speech. Oriented to person, place, and time. Epicritic sensation to light touch grossly present bilaterally.  Dermatologic Nails well groomed and normal in appearance. No open wounds. No skin lesions.  Orthopedic: Normal joint ROM without pain or crepitus bilaterally. No visible deformities. Tender to palpation at the calcaneal tuber left. No pain with calcaneal squeeze left. Ankle ROM full range of motion left. Silfverskiold Test: negative left.   Radiographs: Taken and reviewed. No acute fractures or dislocations. No evidence of stress fracture.  Plantar heel spur absent. Posterior heel spur absent.   Assessment:   1. Plantar fasciitis of left foot  Plan:  Patient was evaluated and treated and all questions answered.  Plantar Fasciitis, left~recurrence with history of EPF and gastrocnemius recession - XR reviewed as above.  - Educated on icing and stretching. Instructions given.  - Injection delivered to the plantar fascia as below. - DME: Plantar Fascial Brace - Pharmacologic management: None  Procedure: Injection Tendon/Ligament Location: Left plantar fascia at the glabrous junction; medial approach. Skin Prep:  alcohol Injectate: 0.5 cc 0.5% marcaine plain, 0.5 cc of 1% Lidocaine, 0.5 cc kenalog 10. Disposition: Patient tolerated procedure well. Injection site dressed with a band-aid.  No follow-ups on file.

## 2023-03-23 ENCOUNTER — Ambulatory Visit: Payer: BC Managed Care – PPO | Admitting: Podiatry

## 2023-04-04 DIAGNOSIS — Z3202 Encounter for pregnancy test, result negative: Secondary | ICD-10-CM | POA: Diagnosis not present

## 2023-04-04 DIAGNOSIS — R059 Cough, unspecified: Secondary | ICD-10-CM | POA: Diagnosis not present

## 2023-04-04 DIAGNOSIS — Z20822 Contact with and (suspected) exposure to covid-19: Secondary | ICD-10-CM | POA: Diagnosis not present

## 2023-05-15 DIAGNOSIS — J45909 Unspecified asthma, uncomplicated: Secondary | ICD-10-CM | POA: Diagnosis not present

## 2023-05-15 DIAGNOSIS — Z20822 Contact with and (suspected) exposure to covid-19: Secondary | ICD-10-CM | POA: Diagnosis not present

## 2023-05-15 DIAGNOSIS — J209 Acute bronchitis, unspecified: Secondary | ICD-10-CM | POA: Diagnosis not present

## 2023-05-25 DIAGNOSIS — J45909 Unspecified asthma, uncomplicated: Secondary | ICD-10-CM | POA: Diagnosis not present

## 2023-05-25 DIAGNOSIS — J45901 Unspecified asthma with (acute) exacerbation: Secondary | ICD-10-CM | POA: Diagnosis not present

## 2023-08-08 DIAGNOSIS — Z20822 Contact with and (suspected) exposure to covid-19: Secondary | ICD-10-CM | POA: Diagnosis not present

## 2023-08-08 DIAGNOSIS — H1032 Unspecified acute conjunctivitis, left eye: Secondary | ICD-10-CM | POA: Diagnosis not present

## 2023-08-08 DIAGNOSIS — H6691 Otitis media, unspecified, right ear: Secondary | ICD-10-CM | POA: Diagnosis not present

## 2023-08-08 DIAGNOSIS — J029 Acute pharyngitis, unspecified: Secondary | ICD-10-CM | POA: Diagnosis not present

## 2023-08-12 ENCOUNTER — Ambulatory Visit
Admission: RE | Admit: 2023-08-12 | Discharge: 2023-08-12 | Disposition: A | Discharge status: Home / Self Care | Payer: BC Managed Care – PPO | Source: Ambulatory Visit | Attending: Family Medicine | Admitting: Family Medicine

## 2023-08-12 ENCOUNTER — Other Ambulatory Visit: Payer: Self-pay

## 2023-08-12 VITALS — BP 136/87 | HR 97 | Temp 98.0°F | Resp 18 | Ht 61.0 in | Wt 244.0 lb

## 2023-08-12 DIAGNOSIS — B349 Viral infection, unspecified: Secondary | ICD-10-CM | POA: Diagnosis not present

## 2023-08-12 DIAGNOSIS — H6991 Unspecified Eustachian tube disorder, right ear: Secondary | ICD-10-CM

## 2023-08-12 NOTE — Discharge Instructions (Addendum)
Complete your course of antibiotics, over-the-counter Flonase and Sudafed as directed on the package for nasal congestion Follow-up for new, worsening symptoms or concerns

## 2023-08-12 NOTE — ED Triage Notes (Addendum)
Pt presents with complaints of nasal congestion, cough, fatigue x 6 days. Pt states she was seen by a provider on Sunday 1/26, diagnosed with right ear infection. Amoxicillin was prescribed however hearing is still muffled in right ear. Pt denies pain currently. Also taking Mucinex. Symptoms are worse at night. Denies fevers at home. Tested for COVID/Flu/Strep on Sunday 1/26, all were negative.

## 2023-08-12 NOTE — ED Provider Notes (Signed)
[facility]    CSN: [id number] Arrival date & time: 08/12/23  1449      History   Chief Complaint Chief Complaint  Patient presents with   Nasal Congestion    Entered by patient    HPI Magdelena Kinsella is a 33 y.o. female.   HPI  Sick for 6 days nonproductive cough, fatigue right ear pain.  Had COVID strep and flu test negative, was diagnosed with a right ear infection treated with amoxicillin, also taking over-the-counter Mucinex and an allergy pill.  Has had occasional vomiting.  Denies States the ears less painful but feels muffled.  Denies documented fever, swollen glands, body aches, chest pain, shortness of breath, nausea, diarrhea.  History reviewed. No pertinent past medical history.  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 No pertinent surgical history.  OB History   No obstetric history on file.      Home Medications    Prior to Admission medications   Medication Sig Start Date End Date Taking? Authorizing Provider  amoxicillin-clavulanate (AUGMENTIN) 875-125 MG tablet SMARTSIG:1 Tablet(s) By Mouth Every 12 Hours 08/08/23  Yes [provider]  albuterol (VENTOLIN HFA) 108 (90 Base) MCG/ACT inhaler Inhale 2 puffs into the lungs every 6 (six) hours as needed for wheezing or shortness of breath. 10/31/20   Waldon Merl, PA-C  azithromycin  (ZITHROMAX) 250 MG tablet Take 2 tablets by mouth on day 1. Then take 1 tablet by mouth once daily x 4 days. 06/12/20   Mayer Masker, PA-C  benzonatate (TESSALON) 100 MG capsule Take 1 capsule (100 mg total) by mouth 2 (two) times daily as needed for cough. 10/25/20   Roxy Horseman, PA-C  cetirizine (ZYRTEC) 10 MG tablet Take 10 mg by mouth daily. 12/14/21   [provider]  clotrimazole-betamethasone (LOTRISONE) cream     [provider]  fluticasone (FLONASE) 50 MCG/ACT nasal spray Place 2 sprays into both nostrils daily. 10/25/20   Roxy Horseman, PA-C  ibuprofen (ADVIL) 800 MG tablet Take 1 tablet (800 mg total) by mouth every 6 (six) hours as needed. 03/02/22   Candelaria Stagers, DPM  medroxyPROGESTERone Acetate 150 MG/ML SUSY Inject 1 mL into the muscle every 3 (three) months. 12/03/21   [provider]  meloxicam (MOBIC) 15 MG tablet TAKE 1 TABLET (15 MG TOTAL) BY MOUTH DAILY. 07/03/22   Candelaria Stagers, DPM  meloxicam (MOBIC) 15 MG tablet TAKE 1 TABLET (15 MG TOTAL) BY MOUTH DAILY. 08/25/22   Candelaria Stagers, DPM  naproxen (NAPROSYN) 500 MG tablet Take 1 tablet (500 mg total) by mouth 2 (two) times daily with a meal. 07/27/20   Daphine Deutscher, Mary-Margaret, FNP  oxyCODONE-acetaminophen (PERCOCET) 5-325 MG tablet Take 1 tablet by mouth every 4 (four) hours as needed for severe pain. 03/02/22   Candelaria Stagers, DPM  predniSONE (DELTASONE) 20 MG tablet Take 2 tablets (40 mg total) by mouth daily  with breakfast. 10/31/20   Waldon Merl, PA-C    Family History Family History  Problem Relation Age of Onset   Hyperlipidemia Mother    Diabetes Mother    Diabetes Maternal Grandmother    Cancer Paternal Aunt        skin cancer   Diabetes Paternal Aunt     Social History Social History   Tobacco Use   Smoking status: Never   Smokeless tobacco: Never  Vaping Use   Vaping status: Never Used  Substance Use Topics   Alcohol use: Yes    Comment: SOMETIME   Drug use: No      Allergies   Patient has no known allergies.   Review of Systems Review of Systems  HENT:  Positive for congestion, ear pain and postnasal drip. Negative for sore throat.   Respiratory:  Positive for cough. Negative for shortness of breath.   Cardiovascular:  Negative for chest pain.  Gastrointestinal:  Negative for abdominal distention, diarrhea and nausea.  Neurological:  Negative for headaches.     Physical Exam Triage Vital Signs ED Triage Vitals  Encounter Vitals Group     BP 08/12/23 1503 136/87     Systolic BP Percentile --      Diastolic BP Percentile --      Pulse Rate 08/12/23 1503 97     Resp 08/12/23 1503 18     Temp 08/12/23 1503 98 F (36.7 C)     Temp Source 08/12/23 1503 Oral     SpO2 08/12/23 1503 98 %     Weight 08/12/23 1500 244 lb (110.7 kg)     Height 08/12/23 1500 5\' 1"  (1.549 m)     Head Circumference --      Peak Flow --      Pain Score 08/12/23 1500 0     Pain Loc --      Pain Education --      Exclude from Growth Chart --    No data found.  Updated Vital Signs BP 136/87 (BP Location: Right Arm)   Pulse 97   Temp 98 F (36.7 C) (Oral)   Resp 18   Ht 5\' 1"  (1.549 m)   Wt 244 lb (110.7 kg)   LMP 08/05/2023 (Exact Date)   SpO2 98%   BMI 46.10 kg/m   Visual Acuity Right Eye Distance:   Left Eye Distance:   Bilateral Distance:    Right Eye Near:   Left Eye Near:    Bilateral Near:     Physical Exam Vitals and nursing note reviewed.  Constitutional:      Appearance: She is not ill-appearing.  HENT:     Head: Normocephalic and atraumatic.     Right Ear: Tympanic membrane and ear canal normal.     Left Ear: Tympanic membrane and ear canal normal.     Nose: Congestion present. No rhinorrhea.     Right Sinus: No maxillary sinus tenderness or frontal sinus tenderness.     Left Sinus: No maxillary sinus tenderness or frontal sinus tenderness.     Mouth/Throat:     Mouth: Mucous membranes are moist.     Pharynx: No  oropharyngeal exudate or posterior oropharyngeal erythema.     Comments: Normal swallowing, clear voice Cardiovascular:     Rate and Rhythm: Normal rate and regular rhythm.     Heart sounds: Normal heart sounds.  Pulmonary:     Effort: Pulmonary effort is normal. No respiratory distress.  Breath sounds: Normal breath sounds. No wheezing, rhonchi or rales.  Musculoskeletal:     Cervical back: Neck supple.  Lymphadenopathy:     Cervical: No cervical adenopathy.  Skin:    General: Skin is warm and dry.  Neurological:     Mental Status: She is alert and oriented to person, place, and time.      UC Treatments / Results  Labs (all labs ordered are listed, but only abnormal results are displayed) Labs Reviewed - No data to display  EKG   Radiology No results found.  Procedures Procedures (including critical care time)  Medications Ordered in UC Medications - No data to display  Initial Impression / Assessment and Plan / UC Course  I have reviewed the triage vital signs and the nursing notes.  Pertinent labs & imaging results that were available during my care of the patient were reviewed by me and considered in my medical decision making (see chart for details).     33 year old female currently on amoxicillin for an ear infection presents with right ear fullness persistent nasal congestion and slight cough.  COVID flu and strep test earlier this week were negative.  She is well-appearing, nontoxic, afebrile, vital signs are stable.  No evidence of otitis media on exam.  Has mild sinus congestion without sinus tenderness.  Discussed with patient ear pain likely due to eustachian tube dysfunction.  Recommend she finish her antibiotics use Sudafed and Flonase congestion, follow-up as needed Final Clinical Impressions(s) / UC Diagnoses   Final diagnoses:  None   Discharge Instructions   None    ED Prescriptions   None    PDMP not reviewed this encounter.   Meliton Rattan, [place] 08/12/23 1517

## 2023-08-16 ENCOUNTER — Telehealth: Payer: BC Managed Care – PPO

## 2023-08-16 DIAGNOSIS — J029 Acute pharyngitis, unspecified: Secondary | ICD-10-CM | POA: Diagnosis not present

## 2023-08-17 MED ORDER — AZITHROMYCIN 250 MG PO TABS: ORAL_TABLET | ORAL | 0 refills | Status: AC

## 2023-08-17 NOTE — Progress Notes (Signed)
  E-Visit for Sore Throat - Strep Symptoms  We are sorry that you are not feeling well.  Here is how we plan to help!  Based on what you have shared with me it is likely that you have strep pharyngitis.  Strep pharyngitis is inflammation and infection in the back of the throat.  This is an infection cause by bacteria and is treated with antibiotics.  Since you have already been treated with Augmentin, I have prescribed Azithromycin  250 mg two tablets today and then one daily for 4 additional days. For throat pain, we recommend over the counter oral pain relief medications such as acetaminophen  or aspirin, or anti-inflammatory medications such as ibuprofen  or naproxen  sodium. Topical treatments such as oral throat lozenges or sprays may be used as needed. Strep infections are not as easily transmitted as other respiratory infections, however we still recommend that you avoid close contact with loved ones, especially the very young and elderly.  Remember to wash your hands thoroughly throughout the day as this is the number one way to prevent the spread of infection and wipe down door knobs and counters with disinfectant.   Home Care: Only take medications as instructed by your medical team. Complete the entire course of an antibiotic. Do not take these medications with alcohol. A steam or ultrasonic humidifier can help congestion.  You can place a towel over your head and breathe in the steam from hot water coming from a faucet. Avoid close contacts especially the very young and the elderly. Cover your mouth when you cough or sneeze. Always remember to wash your hands.  Get Help Right Away If: You develop worsening fever or sinus pain. You develop a severe head ache or visual changes. Your symptoms persist after you have completed your treatment plan.  Make sure you Understand these instructions. Will watch your condition. Will get help right away if you are not doing well or get  worse.   Thank you for choosing an e-visit.  Your e-visit answers were reviewed by a board certified advanced clinical practitioner to complete your personal care plan. Depending upon the condition, your plan could have included both over the counter or prescription medications.  Please review your pharmacy choice. Make sure the pharmacy is open so you can pick up prescription now. If there is a problem, you may contact your provider through [employer] and have the prescription routed to another pharmacy.  Your safety is important to us . If you have drug allergies check your prescription carefully.   For the next 24 hours you can use MyChart to ask questions about today's visit, request a non-urgent call back, or ask for a work or school excuse. You will get an email in the next two days asking about your experience. I hope that your e-visit has been valuable and will speed your recovery. I have spent 5 minutes in review of e-visit questionnaire, review and updating patient chart, medical decision making and response to patient.   Kirk RAMAN Mayers, PA-C

## 2023-08-20 ENCOUNTER — Encounter: Payer: Self-pay | Admitting: *Deleted

## 2023-08-20 ENCOUNTER — Other Ambulatory Visit: Payer: Self-pay

## 2023-08-20 ENCOUNTER — Ambulatory Visit
Admission: EM | Admit: 2023-08-20 | Discharge: 2023-08-20 | Disposition: A | Discharge status: Home / Self Care | Payer: BC Managed Care – PPO | Attending: Internal Medicine | Admitting: Internal Medicine

## 2023-08-20 DIAGNOSIS — B370 Candidal stomatitis: Secondary | ICD-10-CM | POA: Diagnosis not present

## 2023-08-20 DIAGNOSIS — J9801 Acute bronchospasm: Secondary | ICD-10-CM

## 2023-08-20 MED ORDER — PREDNISONE 20 MG PO TABS: 20.0000 mg | ORAL_TABLET | Freq: Every day | ORAL | 0 refills | Status: DC

## 2023-08-20 MED ORDER — NYSTATIN 100000 UNIT/ML MT SUSP: 500000.0000 [IU] | Freq: Four times a day (QID) | OROMUCOSAL | 0 refills | Status: DC

## 2023-08-20 NOTE — ED Provider Notes (Signed)
 [facility]    CSN: [id number] Arrival date & time: 08/20/23  1723      History   Chief Complaint Chief Complaint  Patient presents with   Mouth Lesions    HPI Carolyn Morris is a 33 y.o. female who presents with bumps in the back of her tongue which give her discomfort and episodes of cough attacks since she has been sick with respiratory symptoms for the past 3 weeks. She was on 2 antibiotics since. IN the past when she has the cough she has prednisone  helped.  She denies any fever or SOB.     History reviewed. No pertinent past medical history.  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 No pertinent surgical history.  OB History   No obstetric history on file.      Home Medications    Prior to Admission medications   Medication Sig Start Date End Date Taking? Authorizing Provider  azithromycin  (ZITHROMAX ) 250 MG tablet Take 2 tablets on day 1, then 1 tablet daily on days 2 through 5 08/17/23 08/22/23 Yes Mayers, Cari S, PA-C  nystatin  (MYCOSTATIN ) 100000 UNIT/ML suspension Take 5 mLs (500,000 Units total) by mouth 4 (four) times daily. For 7-10 days to gargle and spit 08/20/23  Yes Rodriguez-Southworth, Kyra, PA-C  predniSONE  (DELTASONE ) 20 MG tablet Take 1 tablet (20 mg total) by mouth daily with breakfast. 08/20/23  Yes Rodriguez-Southworth, Kyra, PA-C  fluticasone   (FLONASE ) 50 MCG/ACT nasal spray Place 2 sprays into both nostrils daily. 10/25/20   Vicky Charleston, PA-C  medroxyPROGESTERone  Acetate 150 MG/ML SUSY Inject 1 mL into the muscle every 3 (three) months. 12/03/21   [provider]    Family History Family History  Problem Relation Age of Onset   Hyperlipidemia Mother    Diabetes Mother    Diabetes Maternal Grandmother    Cancer Paternal Aunt        skin cancer   Diabetes Paternal Aunt     Social History Social History   Tobacco Use   Smoking status: Never    Passive exposure: Current   Smokeless tobacco: Never  Vaping Use   Vaping status: Never Used  Substance Use Topics   Alcohol use: Yes    Comment: SOMETIME   Drug use: No     Allergies   Patient has no known allergies.   Review of Systems Review of Systems As noted in HPI  Physical Exam Triage Vital Signs ED Triage Vitals  Encounter Vitals Group     BP 08/20/23 1745 113/79     Systolic BP Percentile --      Diastolic BP Percentile --      Pulse Rate 08/20/23 1745 77     Resp 08/20/23 1745 16     Temp 08/20/23 1745 97.6 F (36.4 C)     Temp Source 08/20/23 1745 Oral     SpO2 08/20/23 1745 99 %  Weight --      Height --      Head Circumference --      Peak Flow --      Pain Score 08/20/23 1743 4     Pain Loc --      Pain Education --      Exclude from Growth Chart --    No data found.  Updated Vital Signs BP 113/79 (BP Location: Right Arm)   Pulse 77   Temp 97.6 F (36.4 C) (Oral)   Resp 16   LMP 08/05/2023 (Exact Date)   SpO2 99%   Visual Acuity Right Eye Distance:   Left Eye Distance:   Bilateral Distance:    Right Eye Near:   Left Eye Near:    Bilateral Near:     Physical Exam Vitals and nursing note reviewed.  Constitutional:      General: She is not in acute distress.    Appearance: She is obese. She is not toxic-appearing.  HENT:     Right Ear: External ear normal.     Left Ear: External ear normal.     Nose:  Nose normal.     Mouth/Throat:     Mouth: Mucous membranes are moist.     Pharynx: No oropharyngeal exudate or posterior oropharyngeal erythema.     Comments: The posterior taste buds are larger than usual and has a thin white coat matter on L soft palate.  Eyes:     General: No scleral icterus.    Conjunctiva/sclera: Conjunctivae normal.  Cardiovascular:     Rate and Rhythm: Normal rate and regular rhythm.  Pulmonary:     Effort: Pulmonary effort is normal.     Breath sounds: Normal breath sounds.  Musculoskeletal:        General: Normal range of motion.     Cervical back: Neck supple.  Skin:    General: Skin is warm and dry.     Findings: No rash.  Neurological:     Mental Status: She is alert and oriented to person, place, and time.     Gait: Gait normal.  Psychiatric:        Mood and Affect: Mood normal.        Behavior: Behavior normal.        Thought Content: Thought content normal.        Judgment: Judgment normal.      UC Treatments / Results  Labs (all labs ordered are listed, but only abnormal results are displayed) Labs Reviewed - No data to display  EKG   Radiology No results found.  Procedures Procedures (including critical care time)  Medications Ordered in UC Medications - No data to display  Initial Impression / Assessment and Plan / UC Course  I have reviewed the triage vital signs and the nursing notes.  Thrush Bronchospasms  I placed her on Prednisone  and Nystating oral swish.  I explained to her she needs to finished the zpack which cover bronchitis bugs.  Final Clinical Impressions(s) / UC Diagnoses   Final diagnoses:  Oral thrush   Discharge Instructions   None    ED Prescriptions     Medication Sig Dispense Auth. Provider   nystatin  (MYCOSTATIN ) 100000 UNIT/ML suspension Take 5 mLs (500,000 Units total) by mouth 4 (four) times daily. For 7-10 days to gargle and spit 60 mL Rodriguez-Southworth, Kyra, PA-C   predniSONE   (DELTASONE ) 20 MG tablet Take 1 tablet (20 mg total) by mouth daily with breakfast. 5  tablet Rodriguez-Southworth, Kyra, PA-C      PDMP not reviewed this encounter.   Lindi Kyra, PA-C 08/20/23 1853

## 2023-08-20 NOTE — ED Triage Notes (Signed)
 Pt reports she has been sick for a few weeks. She was sent in Urgent Care and treated with antibiotics and OTC meds. Her concern today is bumps on the back of her tongue.

## 2023-11-17 DIAGNOSIS — Z23 Encounter for immunization: Secondary | ICD-10-CM | POA: Diagnosis not present

## 2023-11-17 DIAGNOSIS — Z8639 Personal history of other endocrine, nutritional and metabolic disease: Secondary | ICD-10-CM | POA: Diagnosis not present

## 2023-11-17 DIAGNOSIS — Z0000 Encounter for general adult medical examination without abnormal findings: Secondary | ICD-10-CM | POA: Diagnosis not present

## 2023-11-17 DIAGNOSIS — F331 Major depressive disorder, recurrent, moderate: Secondary | ICD-10-CM | POA: Diagnosis not present

## 2023-11-17 DIAGNOSIS — Z13220 Encounter for screening for lipoid disorders: Secondary | ICD-10-CM | POA: Diagnosis not present

## 2023-11-17 DIAGNOSIS — F411 Generalized anxiety disorder: Secondary | ICD-10-CM | POA: Diagnosis not present

## 2023-11-17 DIAGNOSIS — E66813 Obesity, class 3: Secondary | ICD-10-CM | POA: Diagnosis not present

## 2023-12-01 DIAGNOSIS — F331 Major depressive disorder, recurrent, moderate: Secondary | ICD-10-CM | POA: Diagnosis not present

## 2023-12-01 DIAGNOSIS — F411 Generalized anxiety disorder: Secondary | ICD-10-CM | POA: Diagnosis not present

## 2023-12-17 DIAGNOSIS — K6289 Other specified diseases of anus and rectum: Secondary | ICD-10-CM | POA: Diagnosis not present

## 2023-12-28 DIAGNOSIS — F331 Major depressive disorder, recurrent, moderate: Secondary | ICD-10-CM | POA: Diagnosis not present

## 2023-12-28 DIAGNOSIS — F411 Generalized anxiety disorder: Secondary | ICD-10-CM | POA: Diagnosis not present

## 2024-01-17 ENCOUNTER — Ambulatory Visit
Admission: RE | Admit: 2024-01-17 | Discharge: 2024-01-17 | Disposition: A | Discharge status: Home / Self Care | Source: Ambulatory Visit | Attending: Student

## 2024-01-17 ENCOUNTER — Other Ambulatory Visit: Payer: Self-pay | Admitting: Student

## 2024-01-17 DIAGNOSIS — K529 Noninfective gastroenteritis and colitis, unspecified: Secondary | ICD-10-CM | POA: Diagnosis not present

## 2024-01-17 DIAGNOSIS — K5900 Constipation, unspecified: Secondary | ICD-10-CM

## 2024-01-28 DIAGNOSIS — K529 Noninfective gastroenteritis and colitis, unspecified: Secondary | ICD-10-CM | POA: Diagnosis not present

## 2024-01-31 DIAGNOSIS — R6882 Decreased libido: Secondary | ICD-10-CM | POA: Diagnosis not present

## 2024-01-31 DIAGNOSIS — Z01419 Encounter for gynecological examination (general) (routine) without abnormal findings: Secondary | ICD-10-CM | POA: Diagnosis not present

## 2024-01-31 DIAGNOSIS — N939 Abnormal uterine and vaginal bleeding, unspecified: Secondary | ICD-10-CM | POA: Diagnosis not present

## 2024-01-31 DIAGNOSIS — F528 Other sexual dysfunction not due to a substance or known physiological condition: Secondary | ICD-10-CM | POA: Diagnosis not present

## 2024-02-23 DIAGNOSIS — D509 Iron deficiency anemia, unspecified: Secondary | ICD-10-CM | POA: Diagnosis not present

## 2024-03-29 ENCOUNTER — Inpatient Hospital Stay

## 2024-03-29 ENCOUNTER — Inpatient Hospital Stay: Attending: Hematology and Oncology | Admitting: Hematology and Oncology

## 2024-03-29 VITALS — BP 122/69 | HR 87 | Temp 98.5°F | Resp 20 | Wt 241.5 lb

## 2024-03-29 DIAGNOSIS — D509 Iron deficiency anemia, unspecified: Secondary | ICD-10-CM | POA: Diagnosis not present

## 2024-03-29 DIAGNOSIS — D500 Iron deficiency anemia secondary to blood loss (chronic): Secondary | ICD-10-CM

## 2024-03-29 NOTE — Progress Notes (Signed)
 [facility] CONSULT NOTE  Patient Care Team: Pcp, No as PCP - General  CHIEF COMPLAINTS/PURPOSE OF CONSULTATION:  IDA  ASSESSMENT & PLAN:  Assessment and Plan  This is a very pleasant 33 yr female pt with newly diagnosed IDA referred to hematology for additional recommendations. She is here with her boyfriend. We discussed the following details.  Iron deficiency anemia affects a large proportion of the wellness population especially females of childbearing age and children.  Common causes of iron deficiency include blood loss, reduced iron absorption because of previous surgeries, dietary restrictions or other malabsorption issues, medications that reduce gastric acidity are due to inherited disorders such as IRIDA due to TMPRSS6 mutation. Symptoms of iron deficiency usually include fatigue, pica, restless legs, exercise intolerance, exertional dyspnea, headaches and weakness. Diagnosis can be usually made by history, physical examination, CBC and iron studies.  We generally treat iron deficiency anemia with oral supplements if this can be tolerated.  IV iron is appropriate for patients are unable to tolerate due to gastrointestinal side effects or for patients with severe/ongoing blood loss, history of gastric bypass which reduces gastric acid and henceforth will impair intestinal absorption of oral iron, malabsorption syndromes are occasional in pregnancy. She has been taking oral iron supplements with no significant improvement and hence we have agreed to proceed with IV iron.  There are several formularies of intravenous iron available in the market.  We have discussed about risk of allergic/infusion reactions including potentially life-threatening anaphylaxis with intravenous iron however these serious allergic reactions are exceedingly rare and overestimated.  In contrast to serious allergic reactions, IV iron may be associated with nonallergic infusion reactions including  self-limited urticaria, palpitations, dizziness, neck and back spasm which again occur in less than 1% of the individuals and do not progress to more serious reactions.  Ferraheme ordered. She will proceed with iron infusion and RTC in 3 months with labs.   HISTORY OF PRESENTING ILLNESS:  Carolyn Morris 33 y.o. female is here because of IDA  Discussed the use of AI scribe software for clinical note transcription with the patient, who gave verbal consent to proceed.  History of Present Illness Carolyn Morris is a 33 year old female who presents with fatigue and body aches due to iron deficiency. She was referred by her doctor for consideration of iron infusion due to iron deficiency.  She experiences significant fatigue and body aches, particularly in the lower back area. These symptoms have persisted for a couple of years, coinciding with her use of various forms of birth control, which led to prolonged menstrual bleeding. Initially, she used an implant, resulting in bleeding for a year, followed by shots that caused another six months of bleeding, and then switched to the pill, which led to bleeding for two weeks each month.  She has been taking a slow-release iron supplement every other day for about six months, which she believes is helping but not as quickly as desired. No cravings for ice or starch, lightheadedness, or palpitations. She reports shortness of breath, which she attributes to her weight, and easy bruising. No blood loss in stool or urine.  Her menstrual cycle has improved since discontinuing birth control, now lasting only four days with lighter flow. She is currently taking Lexapro for depression. She previously had plantar fasciitis. There is no known family history of blood disorders or cancers.  All other systems were reviewed with the patient and are negative.  MEDICAL HISTORY:  No past medical history  on file.  SURGICAL HISTORY: No past surgical history on  file.  SOCIAL HISTORY: Social History   Socioeconomic History   Marital status: Single    Spouse name: Not on file   Number of children: Not on file   Years of education: Not on file   Highest education level: Not on file  Occupational History   Occupation: Deli Associate    Employer: [employer]  Tobacco Use   Smoking status: Never    Passive exposure: Current   Smokeless tobacco: Never  Vaping Use   Vaping status: Never Used  Substance and Sexual Activity   Alcohol use: Yes    Comment: SOMETIME   Drug use: No   Sexual activity: Never    Birth control/protection: Pill  Other Topics Concern   Not on file  Social History Narrative   Not on file   Social Drivers of Health   Financial Resource Strain: Not on file  Food Insecurity: No Food Insecurity (03/29/2024)   Hunger Vital Sign    Worried About Running Out of Food in the Last Year: Never true    Ran Out of Food in the Last Year: Never true  Transportation Needs: No Transportation Needs (03/29/2024)   PRAPARE - Administrator, Civil Service (Medical): No    Lack of Transportation (Non-Medical): No  Physical Activity: Not on file  Stress: Not on file  Social Connections: Unknown (03/29/2024)   Social Connection and Isolation Panel    Frequency of Communication with Friends and Family: Never    Frequency of Social Gatherings with Friends and Family: Never    Attends Religious Services: Never    Database administrator or Organizations: Not on file    Attends Banker Meetings: Not on file    Marital Status: Not on file  Intimate Partner Violence: Not At Risk (03/29/2024)   Humiliation, Afraid, Rape, and Kick questionnaire    Fear of Current or Ex-Partner: No    Emotionally Abused: No    Physically Abused: No    Sexually Abused: No    FAMILY HISTORY: Family History  Problem Relation Age of Onset   Hyperlipidemia Mother    Diabetes Mother    Diabetes Maternal Grandmother    Cancer Paternal  Aunt        skin cancer   Diabetes Paternal Aunt     ALLERGIES:  has no known allergies.  MEDICATIONS:  Current Outpatient Medications  Medication Sig Dispense Refill   escitalopram (LEXAPRO) 10 MG tablet Take 10 mg by mouth daily.     fluticasone  (FLONASE ) 50 MCG/ACT nasal spray Place 2 sprays into both nostrils daily. 9.9 mL 0   medroxyPROGESTERone  Acetate 150 MG/ML SUSY Inject 1 mL into the muscle every 3 (three) months.     nystatin  (MYCOSTATIN ) 100000 UNIT/ML suspension Take 5 mLs (500,000 Units total) by mouth 4 (four) times daily. For 7-10 days to gargle and spit (Patient not taking: Reported on 03/29/2024) 60 mL 0   predniSONE  (DELTASONE ) 20 MG tablet Take 1 tablet (20 mg total) by mouth daily with breakfast. (Patient not taking: Reported on 03/29/2024) 5 tablet 0   No current facility-administered medications for this visit.     PHYSICAL EXAMINATION: ECOG PERFORMANCE STATUS: 0 - Asymptomatic  Vitals:   03/29/24 1409  BP: 122/69  Pulse: 87  Resp: 20  Temp: 98.5 F (36.9 C)  SpO2: 100%   Filed Weights   03/29/24 1409  Weight: 241 lb 8  oz (109.5 kg)    GENERAL:alert, no distress and comfortable SKIN: skin color, texture, turgor are normal, no rashes or significant lesions EYES: normal, conjunctiva are pink and non-injected, sclera clear OROPHARYNX:no exudate, no erythema and lips, buccal mucosa, and tongue normal  NECK: supple, thyroid normal size, non-tender, without nodularity LYMPH:  no palpable lymphadenopathy in the cervical, axillary LUNGS: clear to auscultation and percussion with normal breathing effort HEART: regular rate & rhythm and no murmurs and no lower extremity edema ABDOMEN:abdomen soft, non-tender and normal bowel sounds Musculoskeletal:no cyanosis of digits and no clubbing  PSYCH: alert & oriented x 3 with fluent speech NEURO: no focal motor/sensory deficits  LABORATORY DATA:  I have reviewed the data as listed Lab Results  Component Value  Date   WBC 5.8 01/23/2020   HGB 11.9 01/23/2020   HCT 37.9 01/23/2020   MCV 82 01/23/2020   PLT 228 01/23/2020     Chemistry      Component Value Date/Time   NA 139 01/23/2020 1018   K 4.1 01/23/2020 1018   CL 106 01/23/2020 1018   CO2 24 01/23/2020 1018   BUN 12 01/23/2020 1018   CREATININE 0.64 01/23/2020 1018   CREATININE 0.65 04/13/2016 1124      Component Value Date/Time   CALCIUM  8.7 01/23/2020 1018   ALKPHOS 96 01/23/2020 1018   AST 15 01/23/2020 1018   ALT 10 01/23/2020 1018   BILITOT 0.4 01/23/2020 1018       RADIOGRAPHIC STUDIES: I have personally reviewed the radiological images as listed and agreed with the findings in the report. No results found.  All questions were answered. The patient knows to call the clinic with any problems, questions or concerns. I spent 45 minutes in the care of this patient including H and P, review of records, counseling and coordination of care.     Amber Stalls, MD 03/29/2024 2:19 PM

## 2024-03-31 ENCOUNTER — Telehealth: Payer: Self-pay

## 2024-03-31 NOTE — Telephone Encounter (Signed)
 Dr. Loretha, patient will be scheduled as soon as possible.  Auth Submission: NO AUTH NEEDED Site of care: Site of care: [facility] Payer: BCBS commercial Medication & CPT/J Code(s) submitted: Feraheme (ferumoxytol) [id number] Diagnosis Code:  Route of submission (phone, fax, portal): phone Phone # [phone] Fax # Auth type: Buy/Bill PB Units/visits requested: 510mg  x 2 doses Reference number: [id number] Approval from: 03/31/24 to 07/12/24

## 2024-04-07 ENCOUNTER — Ambulatory Visit

## 2024-04-07 VITALS — BP 96/67 | HR 79 | Temp 98.4°F | Resp 16 | Ht 62.0 in | Wt 242.0 lb

## 2024-04-07 DIAGNOSIS — D509 Iron deficiency anemia, unspecified: Secondary | ICD-10-CM | POA: Diagnosis not present

## 2024-04-07 MED ORDER — SODIUM CHLORIDE 0.9 % IV SOLN
510.0000 mg | Freq: Once | INTRAVENOUS | Status: AC
  Administered 2024-04-07: 510 mg via INTRAVENOUS
  Filled 2024-04-07: qty 17

## 2024-04-07 NOTE — Progress Notes (Signed)
 Diagnosis: Acute Anemia  Provider:  Mannam, Praveen MD  Procedure: IV Infusion  IV Type: Peripheral, IV Location: L Hand  Feraheme (Ferumoxytol ), Dose: 510 mg  Infusion Start Time: 1152  Infusion Stop Time: 1210  Post Infusion IV Care: Observation period completed  Discharge: Condition: Good, Destination: Home . AVS Declined  Performed by:  Mathew Therisa NOVAK, RN

## 2024-04-14 ENCOUNTER — Ambulatory Visit

## 2024-04-14 VITALS — BP 110/67 | HR 70 | Temp 98.4°F | Resp 16 | Ht 62.0 in | Wt 241.8 lb

## 2024-04-14 DIAGNOSIS — D509 Iron deficiency anemia, unspecified: Secondary | ICD-10-CM | POA: Diagnosis not present

## 2024-04-14 DIAGNOSIS — D500 Iron deficiency anemia secondary to blood loss (chronic): Secondary | ICD-10-CM

## 2024-04-14 MED ORDER — SODIUM CHLORIDE 0.9 % IV SOLN
510.0000 mg | Freq: Once | INTRAVENOUS | Status: AC
  Administered 2024-04-14: 510 mg via INTRAVENOUS
  Filled 2024-04-14: qty 17

## 2024-04-14 NOTE — Progress Notes (Signed)
 Diagnosis: Iron Deficiency Anemia  Provider:  Praveen Mannam MD  Procedure: IV Infusion  IV Type: Peripheral, IV Location: L Hand  Feraheme (Ferumoxytol ), Dose: 510 mg  Infusion Start Time: 1517  Infusion Stop Time: 1532  Post Infusion IV Care: Observation period completed and Peripheral IV Discontinued  Discharge: Condition: Good, Destination: Home . AVS Declined  Performed by:  Leita FORBES Miles, LPN

## 2024-05-01 ENCOUNTER — Telehealth: Admitting: Physician Assistant

## 2024-05-01 DIAGNOSIS — J208 Acute bronchitis due to other specified organisms: Secondary | ICD-10-CM | POA: Diagnosis not present

## 2024-05-01 DIAGNOSIS — B9689 Other specified bacterial agents as the cause of diseases classified elsewhere: Secondary | ICD-10-CM | POA: Diagnosis not present

## 2024-05-01 MED ORDER — BENZONATATE 100 MG PO CAPS: 100.0000 mg | ORAL_CAPSULE | Freq: Three times a day (TID) | ORAL | 0 refills | Status: AC | PRN

## 2024-05-01 MED ORDER — AZITHROMYCIN 250 MG PO TABS: ORAL_TABLET | ORAL | 0 refills | Status: AC

## 2024-05-01 MED ORDER — PREDNISONE 20 MG PO TABS: 40.0000 mg | ORAL_TABLET | Freq: Every day | ORAL | 0 refills | Status: AC

## 2024-05-01 NOTE — Progress Notes (Signed)
 We are sorry that you are not feeling well.  Here is how we plan to help!  Based on your presentation I believe you most likely have A cough due to bacteria.  When patients have a fever and a productive cough with a change in color or increased sputum production, we are concerned about bacterial bronchitis.  If left untreated it can progress to pneumonia.  If your symptoms do not improve with your treatment plan it is important that you contact your provider.   I have prescribed Azithromyin 250 mg: two tablets now and then one tablet daily for 4 additonal days    In addition you may use A non-prescription cough medication called Mucinex DM: take 2 tablets every 12 hours. and A prescription cough medication called Tessalon  Perles 100mg . You may take 1-2 capsules every 8 hours as needed for your cough.  I have prescribed Prednisone  20mg  Take 2 tablets (40mg ) daily for 5 days.   From your responses in the eVisit questionnaire you describe inflammation in the upper respiratory tract which is causing a significant cough.  This is commonly called Bronchitis and has four common causes:   Allergies Viral Infections Acid Reflux Bacterial Infection Allergies, viruses and acid reflux are treated by controlling symptoms or eliminating the cause. An example might be a cough caused by taking certain blood pressure medications. You stop the cough by changing the medication. Another example might be a cough caused by acid reflux. Controlling the reflux helps control the cough.  USE OF BRONCHODILATOR (RESCUE) INHALERS: There is a risk from using your bronchodilator too frequently.  The risk is that over-reliance on a medication which only relaxes the muscles surrounding the breathing tubes can reduce the effectiveness of medications prescribed to reduce swelling and congestion of the tubes themselves.  Although you feel brief relief from the bronchodilator inhaler, your asthma may actually be worsening with the  tubes becoming more swollen and filled with mucus.  This can delay other crucial treatments, such as oral steroid medications. If you need to use a bronchodilator inhaler daily, several times per day, you should discuss this with your provider.  There are probably better treatments that could be used to keep your asthma under control.     HOME CARE Only take medications as instructed by your medical team. Complete the entire course of an antibiotic. Drink plenty of fluids and get plenty of rest. Avoid close contacts especially the very young and the elderly Cover your mouth if you cough or cough into your sleeve. Always remember to wash your hands A steam or ultrasonic humidifier can help congestion.   GET HELP RIGHT AWAY IF: You develop worsening fever. You become short of breath You cough up blood. Your symptoms persist after you have completed your treatment plan MAKE SURE YOU  Understand these instructions. Will watch your condition. Will get help right away if you are not doing well or get worse.  Your e-visit answers were reviewed by a board certified advanced clinical practitioner to complete your personal care plan.  Depending on the condition, your plan could have included both over the counter or prescription medications. If there is a problem please reply  once you have received a response from your provider. Your safety is important to us .  If you have drug allergies check your prescription carefully.    You can use MyChart to ask questions about today's visit, request a non-urgent call back, or ask for a work or school excuse for  24 hours related to this e-Visit. If it has been greater than 24 hours you will need to follow up with your provider, or enter a new e-Visit to address those concerns. You will get an e-mail in the next two days asking about your experience.  I hope that your e-visit has been valuable and will speed your recovery. Thank you for using e-visits.   I  have spent 5 minutes in review of e-visit questionnaire, review and updating patient chart, medical decision making and response to patient.   Delon CHRISTELLA Dickinson, PA-C

## 2024-06-27 ENCOUNTER — Telehealth: Payer: Self-pay

## 2024-06-27 NOTE — Telephone Encounter (Signed)
 Left message for patient about upcoming appointment on 12/17

## 2024-06-28 ENCOUNTER — Inpatient Hospital Stay: Attending: Hematology and Oncology

## 2024-06-28 ENCOUNTER — Ambulatory Visit: Admitting: Hematology and Oncology

## 2024-06-28 ENCOUNTER — Other Ambulatory Visit: Payer: Self-pay

## 2024-06-28 VITALS — BP 115/65 | HR 72 | Temp 97.8°F | Resp 16 | Wt 248.0 lb

## 2024-06-28 DIAGNOSIS — D509 Iron deficiency anemia, unspecified: Secondary | ICD-10-CM | POA: Insufficient documentation

## 2024-06-28 DIAGNOSIS — D500 Iron deficiency anemia secondary to blood loss (chronic): Secondary | ICD-10-CM | POA: Diagnosis not present

## 2024-06-28 DIAGNOSIS — Z808 Family history of malignant neoplasm of other organs or systems: Secondary | ICD-10-CM | POA: Insufficient documentation

## 2024-06-28 DIAGNOSIS — Z79899 Other long term (current) drug therapy: Secondary | ICD-10-CM | POA: Diagnosis not present

## 2024-06-28 LAB — CMP (CANCER CENTER ONLY)
ALT: 12 U/L (ref 0–44)
AST: 25 U/L (ref 15–41)
Albumin: 4.1 g/dL (ref 3.5–5.0)
Alkaline Phosphatase: 110 U/L (ref 38–126)
Anion gap: 12 (ref 5–15)
BUN: 12 mg/dL (ref 6–20)
CO2: 23 mmol/L (ref 22–32)
Calcium: 9.1 mg/dL (ref 8.9–10.3)
Chloride: 106 mmol/L (ref 98–111)
Creatinine: 0.62 mg/dL (ref 0.44–1.00)
GFR, Estimated: >60 mL/min (ref >60)
Glucose, Bld: 88 mg/dL (ref 70–99)
Potassium: 4.3 mmol/L (ref 3.5–5.1)
Sodium: 141 mmol/L (ref 135–145)
Total Bilirubin: 0.2 mg/dL (ref 0.0–1.2)
Total Protein: 6.3 g/dL — ABNORMAL LOW (ref 6.5–8.1)

## 2024-06-28 LAB — CBC WITH DIFFERENTIAL (CANCER CENTER ONLY)
Abs Immature Granulocytes: 0.02 K/uL (ref 0.00–0.07)
Basophils Absolute: 0 K/uL (ref 0.0–0.1)
Basophils Relative: 1 %
Eosinophils Absolute: 0.4 K/uL (ref 0.0–0.5)
Eosinophils Relative: 5 %
HCT: 42.3 % (ref 36.0–46.0)
Hemoglobin: 14 g/dL (ref 12.0–15.0)
Immature Granulocytes: 0 %
Lymphocytes Relative: 25 %
Lymphs Abs: 1.9 K/uL (ref 0.7–4.0)
MCH: 27.7 pg (ref 26.0–34.0)
MCHC: 33.1 g/dL (ref 30.0–36.0)
MCV: 83.6 fL (ref 80.0–100.0)
Monocytes Absolute: 0.5 K/uL (ref 0.1–1.0)
Monocytes Relative: 7 %
Neutro Abs: 4.8 K/uL (ref 1.7–7.7)
Neutrophils Relative %: 62 %
Platelet Count: 222 K/uL (ref 150–400)
RBC: 5.06 MIL/uL (ref 3.87–5.11)
RDW: 14.8 % (ref 11.5–15.5)
WBC Count: 7.6 K/uL (ref 4.0–10.5)
nRBC: 0 % (ref 0.0–0.2)

## 2024-06-28 LAB — RETIC PANEL
Immature Retic Fract: 9.7 % (ref 2.3–15.9)
RBC.: 5.03 MIL/uL (ref 3.87–5.11)
Retic Count, Absolute: 96.6 K/uL (ref 19.0–186.0)
Retic Ct Pct: 1.9 % (ref 0.4–3.1)
Reticulocyte Hemoglobin: 31 pg (ref >27.9)

## 2024-06-28 LAB — FERRITIN: Ferritin: 69 ng/mL (ref 11–307)

## 2024-06-28 LAB — IRON AND IRON BINDING CAPACITY (CC-WL,HP ONLY)
Iron: 47 ug/dL (ref 28–170)
Saturation Ratios: 16 % (ref 10.4–31.8)
TIBC: 302 ug/dL (ref 250–450)
UIBC: 256 ug/dL

## 2024-06-28 NOTE — Progress Notes (Unsigned)
 [facility] CONSULT NOTE  Patient Care Team: Pcp, No as PCP - General  CHIEF COMPLAINTS/PURPOSE OF CONSULTATION:  IDA  ASSESSMENT & PLAN:  Assessment and Plan  This is a very pleasant 33 yr female pt with newly diagnosed IDA referred to hematology for additional recommendations. She is here with her boyfriend. We discussed the following details.  Assessment and Plan Assessment & Plan Iron deficiency anemia Iron deficiency anemia previously confirmed by low ferritin and hemoglobin, normalized post-iron infusion. No ongoing blood loss identified. Iron panel and ferritin are normal. Ferritin under 100, will recommend oral iron supplementation for 3 months She can RTC as scheduled.   HISTORY OF PRESENTING ILLNESS:  Carolyn Morris 33 y.o. female is here because of IDA  Discussed the use of AI scribe software for clinical note transcription with the patient, who gave verbal consent to proceed.  History of Present Illness  Carolyn Morris is a 33 year old female with iron deficiency anemia who presents for follow-up to assess response to iron infusion therapy.  She was last seen in September for iron deficiency anemia with laboratory studies notable for ferritin 4.4 ng/mL and hemoglobin 11.5 g/dL. She subsequently received an iron infusion, which was well tolerated aside from transient post-infusion fatigue requiring a nap. No other adverse effects were reported.  Her current hemoglobin is 14.0 g/dL. She is awaiting updated ferritin results. She is not currently taking iron supplementation.  She denies ongoing menorrhagia; since discontinuing birth control, menses have become lighter, lasting approximately four days without heavy flow. She denies gastrointestinal or other sources of bleeding.  Her primary prior symptoms were fatigue and easy bruising. She notes improvement in fatigue since the iron infusion, though easy bruising persists. She denies pica or  cravings for non-nutritive substances.  All other systems were reviewed with the patient and are negative.  MEDICAL HISTORY:  No past medical history on file.  SURGICAL HISTORY: No past surgical history on file.  SOCIAL HISTORY: Social History   Socioeconomic History   Marital status: Single    Spouse name: Not on file   Number of children: Not on file   Years of education: Not on file   Highest education level: Not on file  Occupational History   Occupation: Deli Associate    Employer: [employer]  Tobacco Use   Smoking status: Never    Passive exposure: Current   Smokeless tobacco: Never  Vaping Use   Vaping status: Never Used  Substance and Sexual Activity   Alcohol use: Yes    Comment: SOMETIME   Drug use: No   Sexual activity: Never    Birth control/protection: Pill  Other Topics Concern   Not on file  Social History Narrative   Not on file   Social Drivers of Health   Tobacco Use: Medium Risk (08/20/2023)   Patient History    Smoking Tobacco Use: Never    Smokeless Tobacco Use: Never    Passive Exposure: Current  Financial Resource Strain: Not on file  Food Insecurity: No Food Insecurity (03/29/2024)   Epic    Worried About Programme Researcher, Broadcasting/film/video in the Last Year: Never true    Ran Out of Food in the Last Year: Never true  Transportation Needs: No Transportation Needs (03/29/2024)   Epic    Lack of Transportation (Medical): No    Lack of Transportation (Non-Medical): No  Physical Activity: Not on file  Stress: Not on file  Social Connections: Unknown (03/29/2024)   Social Connection  and Isolation Panel    Frequency of Communication with Friends and Family: Never    Frequency of Social Gatherings with Friends and Family: Never    Attends Religious Services: Never    Database Administrator or Organizations: Not on file    Attends Banker Meetings: Not on file    Marital Status: Not on file  Intimate Partner Violence: Not At Risk (03/29/2024)    Epic    Fear of Current or Ex-Partner: No    Emotionally Abused: No    Physically Abused: No    Sexually Abused: No  Depression (PHQ2-9): High Risk (03/29/2024)   Depression (PHQ2-9)    PHQ-2 Score: 12  Alcohol Screen: Not on file  Housing: Low Risk (03/29/2024)   Epic    Unable to Pay for Housing in the Last Year: No    Number of Times Moved in the Last Year: 0    Homeless in the Last Year: No  Utilities: Not At Risk (03/29/2024)   Epic    Threatened with loss of utilities: No  Health Literacy: Not on file    FAMILY HISTORY: Family History  Problem Relation Age of Onset   Hyperlipidemia Mother    Diabetes Mother    Diabetes Maternal Grandmother    Cancer Paternal Aunt        skin cancer   Diabetes Paternal Aunt     ALLERGIES:  has no known allergies.  MEDICATIONS:  Current Outpatient Medications  Medication Sig Dispense Refill   benzonatate  (TESSALON ) 100 MG capsule Take 1-2 capsules (100-200 mg total) by mouth 3 (three) times daily as needed. 30 capsule 0   escitalopram (LEXAPRO) 10 MG tablet Take 10 mg by mouth daily.     predniSONE  (DELTASONE ) 20 MG tablet Take 2 tablets (40 mg total) by mouth daily with breakfast. 10 tablet 0   No current facility-administered medications for this visit.     PHYSICAL EXAMINATION: ECOG PERFORMANCE STATUS: 0 - Asymptomatic  Vitals:   06/28/24 1553  BP: 115/65  Pulse: 72  Resp: 16  Temp: 97.8 F (36.6 C)  SpO2: 100%   Filed Weights   06/28/24 1553  Weight: 248 lb (112.5 kg)    GENERAL:alert, no distress and comfortable SKIN: skin color, texture, turgor are normal, no rashes or significant lesions EYES: normal, conjunctiva are pink and non-injected, sclera clear OROPHARYNX:no exudate, no erythema and lips, buccal mucosa, and tongue normal  NECK: supple, thyroid normal size, non-tender, without nodularity LYMPH:  no palpable lymphadenopathy in the cervical, axillary LUNGS: clear to auscultation and percussion with  normal breathing effort HEART: regular rate & rhythm and no murmurs and no lower extremity edema ABDOMEN:abdomen soft, non-tender and normal bowel sounds Musculoskeletal:no cyanosis of digits and no clubbing  PSYCH: alert & oriented x 3 with fluent speech NEURO: no focal motor/sensory deficits  LABORATORY DATA:  I have reviewed the data as listed Lab Results  Component Value Date   WBC 7.6 06/28/2024   HGB 14.0 06/28/2024   HCT 42.3 06/28/2024   MCV 83.6 06/28/2024   PLT 222 06/28/2024     Chemistry      Component Value Date/Time   NA 139 01/23/2020 1018   K 4.1 01/23/2020 1018   CL 106 01/23/2020 1018   CO2 24 01/23/2020 1018   BUN 12 01/23/2020 1018   CREATININE 0.64 01/23/2020 1018   CREATININE 0.65 04/13/2016 1124      Component Value Date/Time   CALCIUM  8.7 01/23/2020  1018   ALKPHOS 96 01/23/2020 1018   AST 15 01/23/2020 1018   ALT 10 01/23/2020 1018   BILITOT 0.4 01/23/2020 1018       RADIOGRAPHIC STUDIES: I have personally reviewed the radiological images as listed and agreed with the findings in the report. No results found.  All questions were answered. The patient knows to call the clinic with any problems, questions or concerns. I spent 30 minutes in the care of this patient including H and P, review of records, counseling and coordination of care.     Amber Stalls, MD 06/28/2024 4:01 PM

## 2024-06-29 ENCOUNTER — Ambulatory Visit: Payer: Self-pay | Admitting: Hematology and Oncology

## 2024-06-29 NOTE — Telephone Encounter (Signed)
-----   Message from Amber Stalls, MD sent at 06/29/2024 11:37 AM EST ----- Iron labs and CBC look good. Ferritin is normal but under 100. If she can take oral iron every other day for 3 months, ferrous sulfate 65 mg, this might bring it close to 100.  Thanks,

## 2024-06-29 NOTE — Telephone Encounter (Signed)
 Attempted to call pt. LVM for call back

## 2024-07-03 NOTE — Progress Notes (Signed)
 Second attempt to call pt with results. LVM for call back.

## 2024-07-03 NOTE — Progress Notes (Signed)
 Pt called back and was given results and recommendation. She verbalized agreement.

## 2024-12-27 ENCOUNTER — Inpatient Hospital Stay

## 2024-12-27 ENCOUNTER — Inpatient Hospital Stay: Admitting: Hematology and Oncology
